# Patient Record
Sex: Female | Born: 1985 | Hispanic: Yes | Marital: Single | Smoking: Never smoker
Health system: Southern US, Community
[De-identification: ages and names within clinical notes are randomized; demographics above are authoritative.]

## PROBLEM LIST (undated history)

## (undated) ENCOUNTER — Inpatient Hospital Stay (HOSPITAL_COMMUNITY): Payer: Self-pay

## (undated) DIAGNOSIS — Z789 Other specified health status: Secondary | ICD-10-CM

## (undated) HISTORY — PX: NO PAST SURGERIES: SHX2092

---
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
## 2006-07-01 ENCOUNTER — Observation Stay (HOSPITAL_COMMUNITY): Admission: EM | Admit: 2006-07-01 | Discharge: 2006-07-04 | Payer: Self-pay | Admitting: Emergency Medicine

## 2012-04-24 ENCOUNTER — Emergency Department (HOSPITAL_COMMUNITY)
Admission: EM | Admit: 2012-04-24 | Discharge: 2012-04-24 | Disposition: A | Discharge status: Home / Self Care | Payer: Self-pay | Attending: Emergency Medicine | Admitting: Emergency Medicine

## 2012-04-24 ENCOUNTER — Encounter (HOSPITAL_COMMUNITY): Payer: Self-pay

## 2012-04-24 DIAGNOSIS — R300 Dysuria: Secondary | ICD-10-CM | POA: Insufficient documentation

## 2012-04-24 DIAGNOSIS — Z3202 Encounter for pregnancy test, result negative: Secondary | ICD-10-CM | POA: Insufficient documentation

## 2012-04-24 LAB — URINE MICROSCOPIC-ADD ON

## 2012-04-24 LAB — URINALYSIS, ROUTINE W REFLEX MICROSCOPIC
Bilirubin Urine: NEGATIVE
Glucose, UA: NEGATIVE mg/dL
Hgb urine dipstick: NEGATIVE
Ketones, ur: NEGATIVE mg/dL
Nitrite: NEGATIVE
Protein, ur: NEGATIVE mg/dL
Specific Gravity, Urine: 1.019 (ref 1.005–1.030)
Urobilinogen, UA: 0.2 mg/dL (ref 0.0–1.0)
pH: 6 (ref 5.0–8.0)

## 2012-04-24 LAB — POCT PREGNANCY, URINE: Preg Test, Ur: NEGATIVE

## 2012-04-24 MED ORDER — CEPHALEXIN 500 MG PO CAPS: 500.0000 mg | ORAL_CAPSULE | Freq: Four times a day (QID) | ORAL | Status: DC

## 2012-04-24 MED ORDER — OXYCODONE-ACETAMINOPHEN 5-325 MG PO TABS
1.0000 | ORAL_TABLET | Freq: Once | ORAL | Status: AC
  Administered 2012-04-24: 1 via ORAL
  Filled 2012-04-24: qty 1

## 2012-04-24 MED ORDER — IBUPROFEN 400 MG PO TABS
600.0000 mg | ORAL_TABLET | Freq: Once | ORAL | Status: AC
  Administered 2012-04-24: 600 mg via ORAL
  Filled 2012-04-24: qty 1

## 2012-04-24 NOTE — ED Notes (Signed)
Pt c/o pain with urination for past three days. No vaginal bleeding noted.

## 2012-04-25 LAB — URINE CULTURE: Colony Count: 45000

## 2012-04-27 NOTE — ED Provider Notes (Signed)
History    27 year old female with dysuria. Onset about 2 days ago. Complaining of painful urination which she describes as burning. No other complaints. No hematuria. No unusual vaginal bleeding or discharge. Abdominal or back pain. No fevers or chills. No nausea or vomiting. No other complaints. Intervention prior to arrival.  CSN: [id number]  Arrival date & time 04/24/12  1106   First MD Initiated Contact with Patient 04/24/12 1237      Chief Complaint  Patient presents with  . Dysuria    (Consider location/radiation/quality/duration/timing/severity/associated sxs/prior treatment) HPI  History reviewed. No pertinent past medical history.  History reviewed. No pertinent past surgical history.  No family history on file.  History  Substance Use Topics  . Smoking status: Never Smoker   . Smokeless tobacco: Not on file  . Alcohol Use: No    OB History   Grav Para Term Preterm Abortions TAB SAB Ect Mult Living                  Review of Systems  All systems reviewed and negative, other than as noted in HPI.   Allergies  Review of patient's allergies indicates no known allergies.  Home Medications   Current Outpatient Rx  Name  Route  Sig  Dispense  Refill  . cephALEXin (KEFLEX) 500 MG capsule   Oral   Take 1 capsule (500 mg total) by mouth 4 (four) times daily.   20 capsule   0     BP 94/55  Pulse 74  Temp(Src) 97.7 F (36.5 C) (Oral)  Resp 18  SpO2 99%  Physical Exam  Nursing note and vitals reviewed. Constitutional: She appears well-developed and well-nourished. No distress.  HENT:  Head: Normocephalic and atraumatic.  Eyes: Conjunctivae are normal. Right eye exhibits no discharge. Left eye exhibits no discharge.  Neck: Neck supple.  Cardiovascular: Normal rate, regular rhythm and normal heart sounds.  Exam reveals no gallop and no friction rub.   No murmur heard. Pulmonary/Chest: Effort normal and breath sounds normal. No respiratory distress.   Abdominal: Soft. She exhibits no distension. There is no tenderness.  Genitourinary:  No costovertebral angle tenderness  Musculoskeletal: She exhibits no edema and no tenderness.  Neurological: She is alert.  Skin: Skin is warm and dry.  Psychiatric: She has a normal mood and affect. Her behavior is normal. Thought content normal.    ED Course  Procedures (including critical care time)  Labs Reviewed  URINALYSIS, ROUTINE W REFLEX MICROSCOPIC - Abnormal; Notable for the following:    Leukocytes, UA SMALL (*)    All other components within normal limits  URINE MICROSCOPIC-ADD ON - Abnormal; Notable for the following:    Bacteria, UA FEW (*)    All other components within normal limits  URINE CULTURE  POCT PREGNANCY, URINE   No results found.   1. Dysuria       MDM  27 year old female with dysuria. Benign abdominal examination. Afebrile and well appearing. Urinalysis is not particularly impressive, but given patient's symptoms will treat. Emergent return precautions were discussed        Raeford Razor, MD 04/27/12 0006

## 2013-04-27 ENCOUNTER — Ambulatory Visit: Payer: Self-pay | Admitting: Family Medicine

## 2013-04-27 VITALS — BP 102/62 | HR 62 | Temp 98.1°F | Resp 18 | Ht 61.75 in | Wt 167.0 lb

## 2013-04-27 DIAGNOSIS — N898 Other specified noninflammatory disorders of vagina: Secondary | ICD-10-CM

## 2013-04-27 DIAGNOSIS — R350 Frequency of micturition: Secondary | ICD-10-CM

## 2013-04-27 DIAGNOSIS — M549 Dorsalgia, unspecified: Secondary | ICD-10-CM

## 2013-04-27 LAB — POCT UA - MICROSCOPIC ONLY
CASTS, UR, LPF, POC: NEGATIVE
CRYSTALS, UR, HPF, POC: NEGATIVE
MUCUS UA: NEGATIVE
RBC, URINE, MICROSCOPIC: NEGATIVE
WBC, Ur, HPF, POC: NEGATIVE
Yeast, UA: NEGATIVE

## 2013-04-27 LAB — POCT CBC
GRANULOCYTE PERCENT: 57 % (ref 37–80)
HCT, POC: 43.1 % (ref 37.7–47.9)
Hemoglobin: 13.5 g/dL (ref 12.2–16.2)
Lymph, poc: 2 (ref 0.6–3.4)
MCH: 30.3 pg (ref 27–31.2)
MCHC: 31.3 g/dL — AB (ref 31.8–35.4)
MCV: 96.8 fL (ref 80–97)
MID (CBC): 0.3 (ref 0–0.9)
MPV: 9.2 fL (ref 0–99.8)
PLATELET COUNT, POC: 217 10*3/uL (ref 142–424)
POC GRANULOCYTE: 3.1 (ref 2–6.9)
POC LYMPH PERCENT: 36.7 %L (ref 10–50)
POC MID %: 6.3 % (ref 0–12)
RBC: 4.45 M/uL (ref 4.04–5.48)
RDW, POC: 13.7 %
WBC: 5.5 10*3/uL (ref 4.6–10.2)

## 2013-04-27 LAB — POCT URINALYSIS DIPSTICK
Bilirubin, UA: NEGATIVE
Glucose, UA: NEGATIVE
KETONES UA: NEGATIVE
Leukocytes, UA: NEGATIVE
Nitrite, UA: NEGATIVE
PH UA: 7
PROTEIN UA: NEGATIVE
RBC UA: NEGATIVE
SPEC GRAV UA: 1.01
Urobilinogen, UA: 0.2

## 2013-04-27 LAB — POCT WET PREP WITH KOH
KOH Prep POC: NEGATIVE
Trichomonas, UA: NEGATIVE
Yeast Wet Prep HPF POC: NEGATIVE

## 2013-04-27 LAB — POCT URINE PREGNANCY: Preg Test, Ur: NEGATIVE

## 2013-04-27 MED ORDER — CEPHALEXIN 500 MG PO CAPS: 500.0000 mg | ORAL_CAPSULE | Freq: Four times a day (QID) | ORAL | Status: DC

## 2013-04-27 NOTE — Progress Notes (Addendum)
[facility] [address], [place] [id number] [phone]  Date:  04/27/2013   Name:  Erin Carney   DOB:  May 20, 1985   MRN:  [id number]  PCP:  Pcp Not In System    Chief Complaint: Nocturia and Flank Pain   History of Present Illness:  Erin Carney is a 28 y.o. very pleasant female patient who presents with the following:  Here today with illness. New pt to our clinic.   She has a urinary complaint.  She notes that she passes just a little urine at a time, and her back "burns".  She has had this problem for 2 months. Frequency seems to be worse at night.  For the last week she feels like she will need to go but nothing will be there.  Also admits she has noted vaginal discharge for about 2 weeks.    No fever, no nausea or vomiting.   No blood in her urine.   She is generally healthy.   Her LMP was 04/07/12.   She was seen in the ED about one year ago with similar sx and was tx for a UTI- her urine culture at that time grew mixed bacteria.  She states this is the same thing.    [place].  Speaks no English  On exam noted a mild tenderness in her RLQ- she states this has been present for 10 years since she delivered a baby  There are no active problems to display for this patient.   No past medical history on file.  No past surgical history on file.  History  Substance Use Topics  . Smoking status: Never Smoker   . Smokeless tobacco: Not on file  . Alcohol Use: No    No family history on file.  No Known Allergies  Medication list has been reviewed and updated.  No current outpatient prescriptions on file prior to visit.   No current facility-administered medications on file prior to visit.    Review of Systems:  As per HPI- otherwise negative.   Physical Examination: Filed Vitals:   04/27/13 1237  BP: 102/62  Pulse: 62  Temp: 98.1 F (36.7 C)  Resp: 18   Filed Vitals:   04/27/13 1237  Height: 5'  1.75" (1.568 m)  Weight: 167 lb (75.751 kg)   Body mass index is 30.81 kg/(m^2). Ideal Body Weight: Weight in (lb) to have BMI = 25: 135.3  GEN: WDWN, NAD, Non-toxic, A & O x 3, overweight, looks well HEENT: Atraumatic, Normocephalic. Neck supple. No masses, No LAD. Ears and Nose: No external deformity. CV: RRR, No M/G/R. No JVD. No thrill. No extra heart sounds. PULM: CTA B, no wheezes, crackles, rhonchi. No retractions. No resp. distress. No accessory muscle use. ABD: S, NT, ND, +BS. No rebound. No HSM.  No CVA tenderness.  Minimal RLQ tenderness  EXTR: No c/c/e NEURO Normal gait.  PSYCH: Normally interactive. Conversant. Not depressed or anxious appearing.  Calm demeanor.  GU: normal exam, no discharge, lesions or CMT.  No adnexal masses or tenderness.    Results for orders placed in visit on 04/27/13  POCT UA - MICROSCOPIC ONLY      Result Value Ref Range   WBC, Ur, HPF, POC neg     RBC, urine, microscopic neg     Bacteria, U Microscopic 1+     Mucus, UA neg     Epithelial cells, urine  per micros 1-5     Crystals, Ur, HPF, POC neg     Casts, Ur, LPF, POC neg     Yeast, UA neg    POCT URINALYSIS DIPSTICK      Result Value Ref Range   Color, UA yellow     Clarity, UA clear     Glucose, UA neg     Bilirubin, UA neg     Ketones, UA neg     Spec Grav, UA 1.010     Blood, UA neg     pH, UA 7.0     Protein, UA neg     Urobilinogen, UA 0.2     Nitrite, UA neg     Leukocytes, UA Negative    POCT CBC      Result Value Ref Range   WBC 5.5  4.6 - 10.2 K/uL   Lymph, poc 2.0  0.6 - 3.4   POC LYMPH PERCENT 36.7  10 - 50 %L   MID (cbc) 0.3  0 - 0.9   POC MID % 6.3  0 - 12 %M   POC Granulocyte 3.1  2 - 6.9   Granulocyte percent 57.0  37 - 80 %G   RBC 4.45  4.04 - 5.48 M/uL   Hemoglobin 13.5  12.2 - 16.2 g/dL   HCT, POC 16.1  09.6 - 47.9 %   MCV 96.8  80 - 97 fL   MCH, POC 30.3  27 - 31.2 pg   MCHC 31.3 (*) 31.8 - 35.4 g/dL   RDW, POC 04.5     Platelet Count, POC 217  142  - 424 K/uL   MPV 9.2  0 - 99.8 fL  POCT WET PREP WITH KOH      Result Value Ref Range   Trichomonas, UA Negative     Clue Cells Wet Prep HPF POC 0-1     Epithelial Wet Prep HPF POC 2-5     Yeast Wet Prep HPF POC Neg     Bacteria Wet Prep HPF POC 3+     RBC Wet Prep HPF POC 0-2     WBC Wet Prep HPF POC 0-7     KOH Prep POC Negative    POCT URINE PREGNANCY      Result Value Ref Range   Preg Test, Ur Negative     Assessment and Plan: Urinary frequency - Plan: POCT UA - Microscopic Only, POCT urinalysis dipstick, POCT CBC, GC/Chlamydia Probe Amp, Urine culture, cephALEXin (KEFLEX) 500 MG capsule  Back pain - Plan: POCT urine pregnancy  Vaginal discharge - Plan: POCT Wet Prep with KOH  Possible UTI but her urine is not of much concern.  Will treat with keflex while we await her urine culture and genprobe results.   She does endorse mild RLQ tenderness- however her CBC is normal and she states this has been stable for 10 years.   Will follow-up with labs, asked her to please let me know if any problems in the meantime or if she is getting worse  Signed Abbe Amsterdam, MD  2/21: have tried to reach her by phone for the last few days, no answer and VM not set up.   Will send her a letter

## 2013-04-28 LAB — URINE CULTURE
COLONY COUNT: NO GROWTH
Organism ID, Bacteria: NO GROWTH

## 2013-04-28 LAB — GC/CHLAMYDIA PROBE AMP
CT PROBE, AMP APTIMA: NEGATIVE
GC Probe RNA: NEGATIVE

## 2013-05-01 ENCOUNTER — Encounter: Payer: Self-pay | Admitting: Family Medicine

## 2014-07-25 ENCOUNTER — Inpatient Hospital Stay (HOSPITAL_COMMUNITY): Payer: Self-pay

## 2014-07-25 ENCOUNTER — Encounter (HOSPITAL_COMMUNITY): Payer: Self-pay | Admitting: *Deleted

## 2014-07-25 ENCOUNTER — Inpatient Hospital Stay (HOSPITAL_COMMUNITY)
Admission: AD | Admit: 2014-07-25 | Discharge: 2014-07-25 | Disposition: A | Discharge status: Home / Self Care | Payer: Self-pay | Source: Ambulatory Visit | Attending: Family Medicine | Admitting: Family Medicine

## 2014-07-25 DIAGNOSIS — O208 Other hemorrhage in early pregnancy: Secondary | ICD-10-CM | POA: Insufficient documentation

## 2014-07-25 DIAGNOSIS — O209 Hemorrhage in early pregnancy, unspecified: Secondary | ICD-10-CM

## 2014-07-25 DIAGNOSIS — Z3A01 Less than 8 weeks gestation of pregnancy: Secondary | ICD-10-CM | POA: Insufficient documentation

## 2014-07-25 LAB — POCT PREGNANCY, URINE: Preg Test, Ur: POSITIVE — AB

## 2014-07-25 LAB — CBC WITH DIFFERENTIAL/PLATELET
BASOS ABS: 0 10*3/uL (ref 0.0–0.1)
Basophils Relative: 0 % (ref 0–1)
EOS PCT: 1 % (ref 0–5)
Eosinophils Absolute: 0 10*3/uL (ref 0.0–0.7)
HEMATOCRIT: 37.9 % (ref 36.0–46.0)
HEMOGLOBIN: 13.3 g/dL (ref 12.0–15.0)
LYMPHS PCT: 29 % (ref 12–46)
Lymphs Abs: 1.9 10*3/uL (ref 0.7–4.0)
MCH: 31.1 pg (ref 26.0–34.0)
MCHC: 35.1 g/dL (ref 30.0–36.0)
MCV: 88.8 fL (ref 78.0–100.0)
MONO ABS: 0.4 10*3/uL (ref 0.1–1.0)
MONOS PCT: 7 % (ref 3–12)
NEUTROS ABS: 4.2 10*3/uL (ref 1.7–7.7)
Neutrophils Relative %: 63 % (ref 43–77)
Platelets: 226 10*3/uL (ref 150–400)
RBC: 4.27 MIL/uL (ref 3.87–5.11)
RDW: 12.8 % (ref 11.5–15.5)
WBC: 6.7 10*3/uL (ref 4.0–10.5)

## 2014-07-25 LAB — WET PREP, GENITAL
Clue Cells Wet Prep HPF POC: NONE SEEN
TRICH WET PREP: NONE SEEN
YEAST WET PREP: NONE SEEN

## 2014-07-25 LAB — URINALYSIS, ROUTINE W REFLEX MICROSCOPIC
Bilirubin Urine: NEGATIVE
GLUCOSE, UA: NEGATIVE mg/dL
KETONES UR: NEGATIVE mg/dL
Nitrite: NEGATIVE
PROTEIN: NEGATIVE mg/dL
Specific Gravity, Urine: 1.015 (ref 1.005–1.030)
UROBILINOGEN UA: 0.2 mg/dL (ref 0.0–1.0)
pH: 6 (ref 5.0–8.0)

## 2014-07-25 LAB — URINE MICROSCOPIC-ADD ON

## 2014-07-25 LAB — HCG, QUANTITATIVE, PREGNANCY: HCG, BETA CHAIN, QUANT, S: 85060 m[IU]/mL — AB (ref <5)

## 2014-07-25 LAB — ABO/RH: ABO/RH(D): O POS

## 2014-07-25 LAB — OB RESULTS CONSOLE GC/CHLAMYDIA: Gonorrhea: NEGATIVE

## 2014-07-25 IMAGING — US US OB TRANSVAGINAL
1 series · 14 of 28 positions shown · non-contrast
Comparison: None.

CLINICAL DATA: Vaginal bleeding

EXAM:
OBSTETRIC <14 WK US AND TRANSVAGINAL OB US
TECHNIQUE: Both transabdominal and transvaginal ultrasound examinations were
performed for complete evaluation of the gestation as well as the
maternal uterus, adnexal regions, and pelvic cul-de-sac.
Transvaginal technique was performed to assess early pregnancy.

[Series 1: us ob comp less 14 wk · 55 acquisitions, 14 frames shown]
[im 3/55]
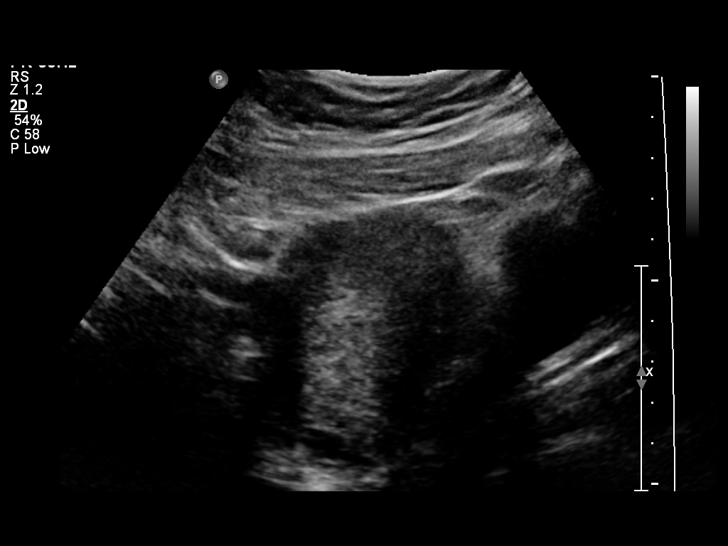
[im 7/55]
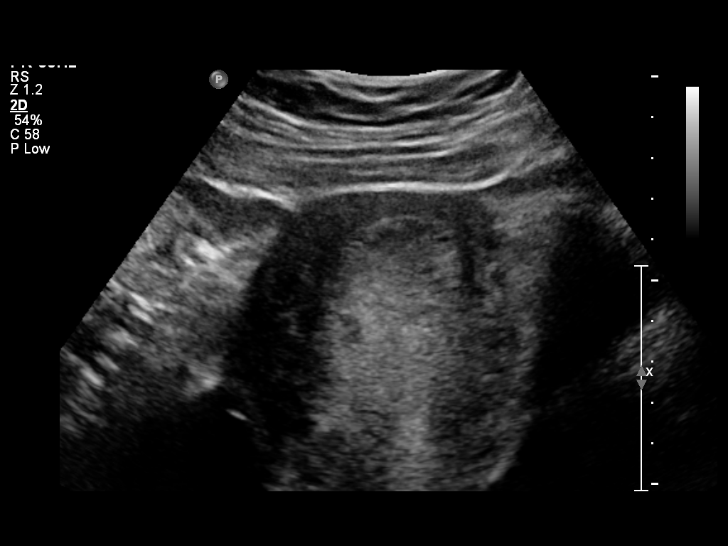
[im 11/55]
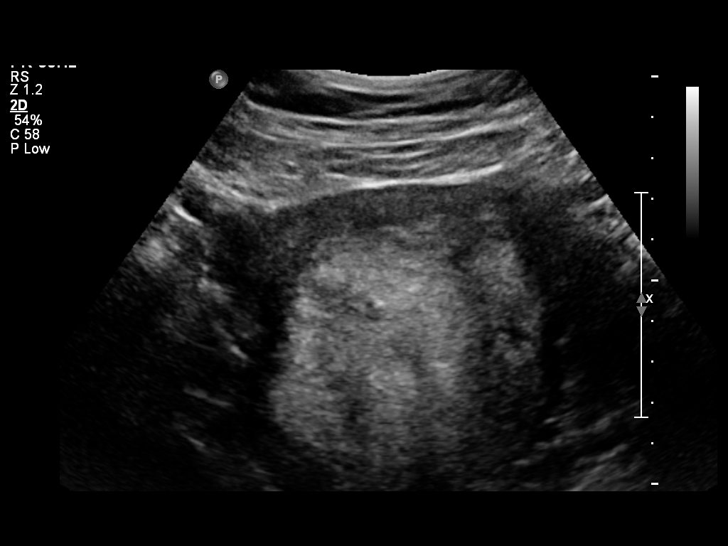
[im 15/55]
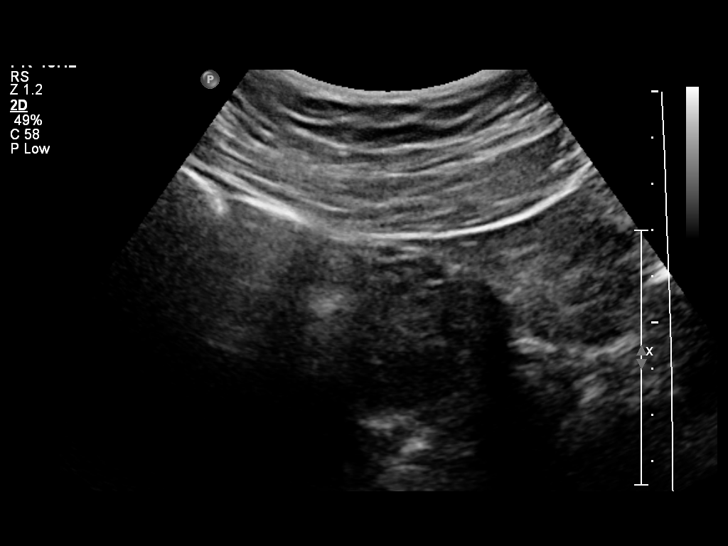
[im 19/55]
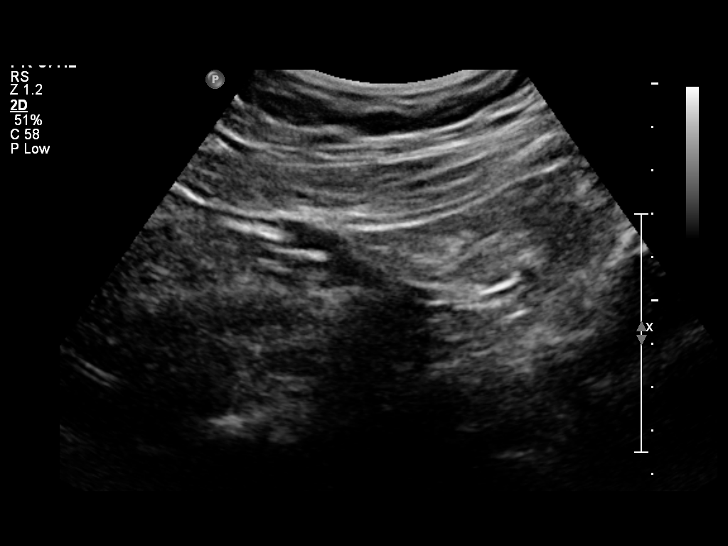
[im 23/55]
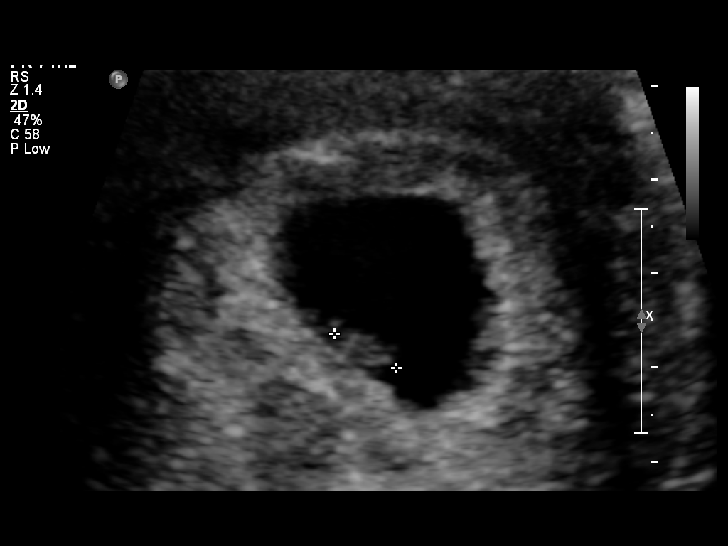
[im 27/55]
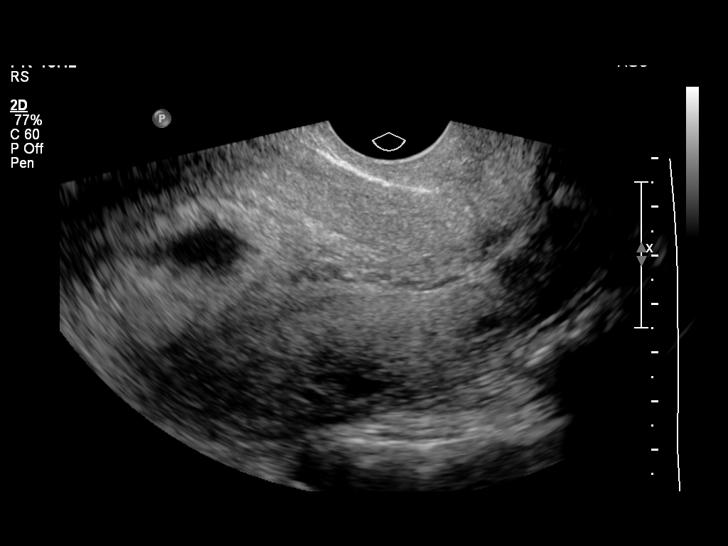
[im 31/55]
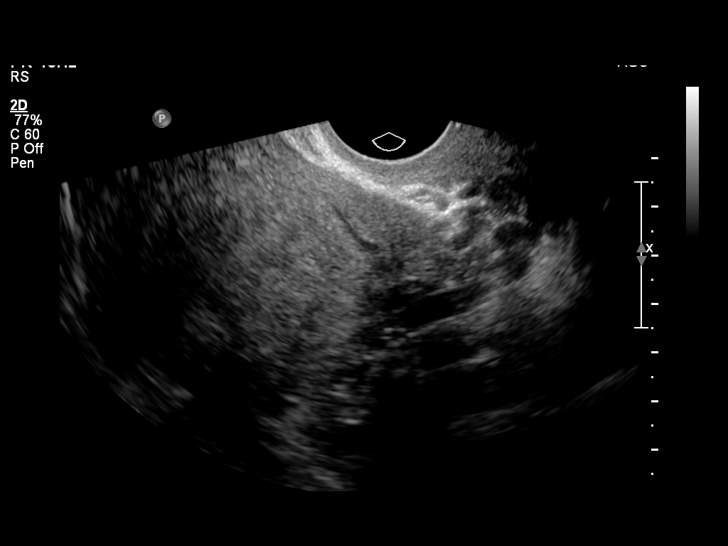
[im 35/55]
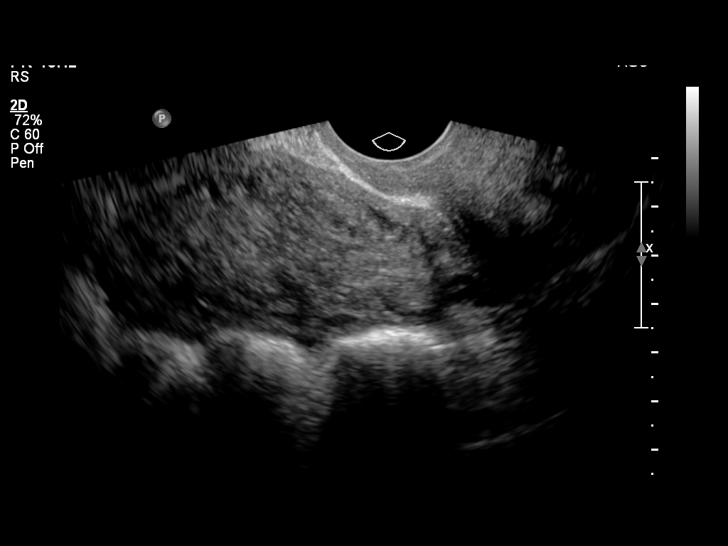
[im 39/55]
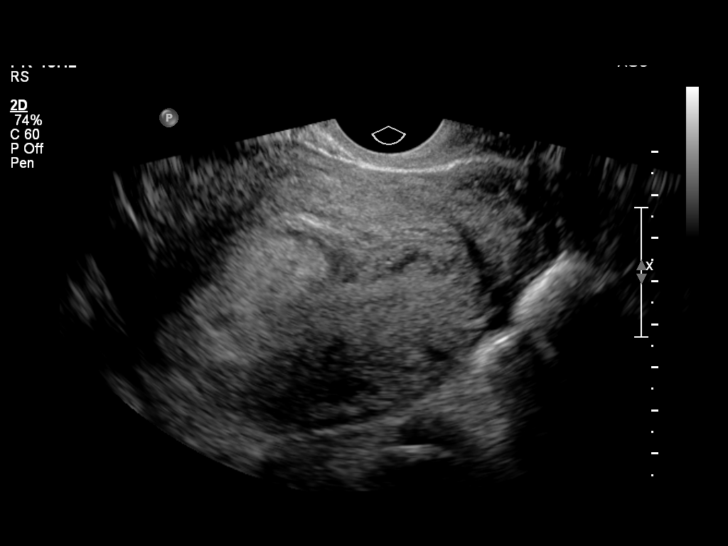
[im 43/55]
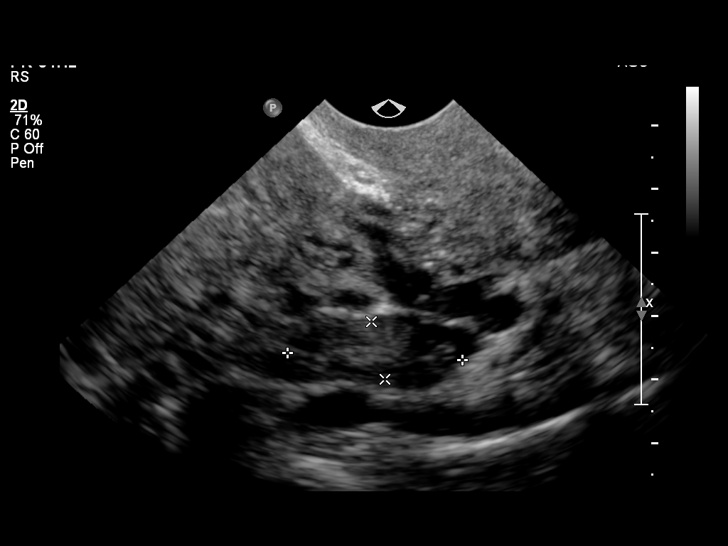
[im 47/55]
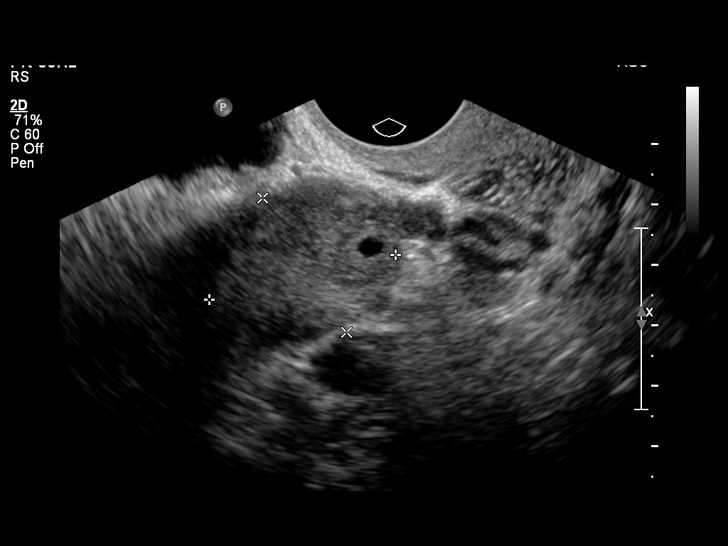
[im 51/55]
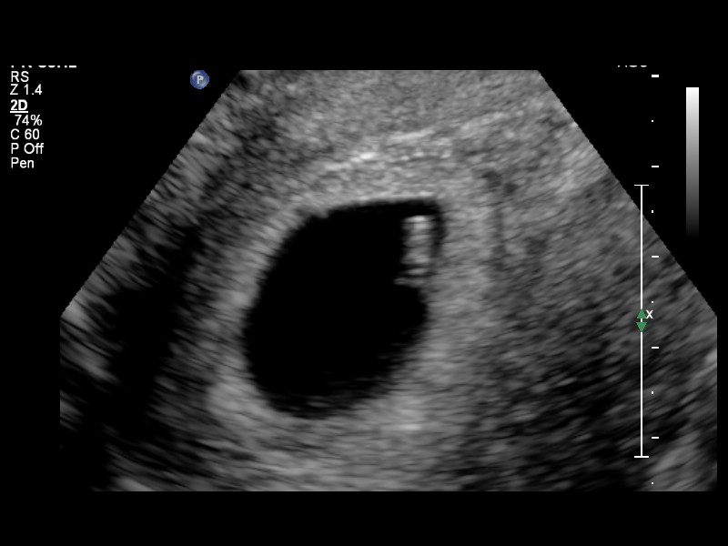
[im 55/55]
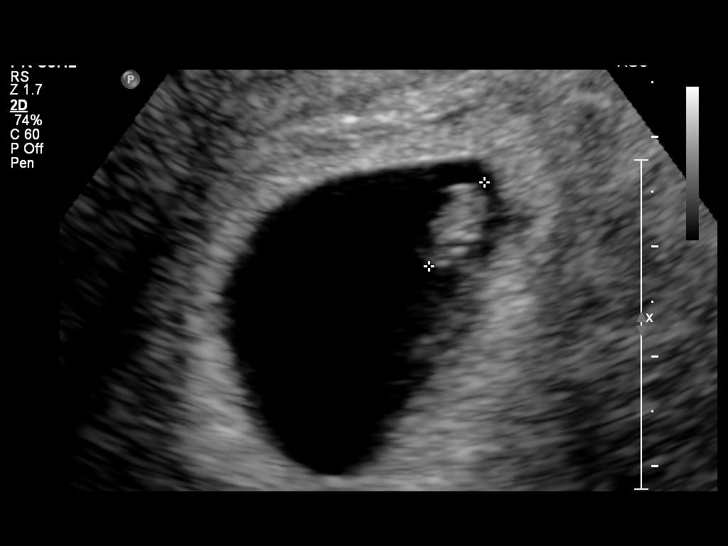

[14 of 28 positions shown; findings below may reference images not displayed]

FINDINGS: Intrauterine gestational sac: Visualized/normal in shape.

Yolk sac:  Present

Embryo:  Present

Cardiac Activity: Present

Heart Rate: 128  bpm

CRL:  8.8  mm   7 w   0 d                  US EDC: [DATE]

Maternal uterus/adnexae: No adnexal mass. Normal bilateral ovaries.
The right ovary measures 2.8 x 0.9 x 3.1 cm. Left ovary measures
x 2.6 x 2.8 cm. There is a small subcentimeter subchorionic
hemorrhage. There is no pelvic free fluid.
IMPRESSION: 1. Single live intrauterine pregnancy.
2. Small subchorionic hemorrhage. Attention on follow-up examination
is recommended.

## 2014-07-25 NOTE — MAU Note (Signed)
Pt reports she started having vag bleeding and some cramping that started today

## 2014-07-25 NOTE — MAU Provider Note (Signed)
History     CSN: [id number]  Arrival date and time: 07/25/14 1309   First Provider Initiated Contact with Patient 07/25/14 1333      Chief Complaint  Patient presents with  . Vaginal Bleeding  . Abdominal Pain   HPI [name] is a 29 y.o. G1P0 at Unknown who presents to MAU today with complaint of vaginal bleeding. The patient states light bleeding started today. She states mild cramping of the lower abdomen associated with the bleeding. The denies N/V/D or fever. LMP 05/28/14. Patient states +UPT at [facility] recently.   OB History    Gravida Para Term Preterm AB TAB SAB Ectopic Multiple Living   0      3      History reviewed. No pertinent past medical history.  History reviewed. No pertinent past surgical history.  No family history on file.  History  Substance Use Topics  . Smoking status: Never Smoker   . Smokeless tobacco: Never Used  . Alcohol Use: No    Allergies: No Known Allergies  Prescriptions prior to admission  Medication Sig Dispense Refill Last Dose  . Prenatal Vit-Fe Fumarate-FA (PRENATAL MULTIVITAMIN) TABS tablet Take 1 tablet by mouth daily at 12 noon.   07/24/2014 at Unknown time  . cephALEXin (KEFLEX) 500 MG capsule Take 1 capsule (500 mg total) by mouth 4 (four) times daily. (Patient not taking: Reported on 07/25/2014) 14 capsule 7     Review of Systems  Constitutional: Negative for fever and malaise/fatigue.  Gastrointestinal: Positive for abdominal pain. Negative for nausea, vomiting, diarrhea and constipation.  Genitourinary:       + vaginal bleeding   Physical Exam   Blood pressure 96/58, pulse 65, temperature 97.9 F (36.6 C), temperature source Oral, resp. rate 18, weight 175 lb (79.379 kg), last menstrual period 05/28/2014.  Physical Exam  Nursing note and vitals reviewed. Constitutional: She is oriented to person, place, and time. She appears well-developed and well-nourished. No distress.  HENT:  Head: Normocephalic and  atraumatic.  Cardiovascular: Normal rate.   Respiratory: Effort normal.  GI: Soft. She exhibits no distension and no mass. There is no tenderness. There is no rebound and no guarding.  Genitourinary: Uterus is tender (mild). Uterus is not enlarged (exam limited by maternal body habitus). Cervix exhibits no motion tenderness, no discharge and no friability. Right adnexum displays tenderness (mild). Right adnexum displays no mass. Left adnexum displays tenderness (mild). Left adnexum displays no mass. There is bleeding (scant brown blood in the vagina) in the vagina. No vaginal discharge found.  Neurological: She is alert and oriented to person, place, and time.  Skin: Skin is warm and dry. No erythema.  Psychiatric: She has a normal mood and affect.   Results for orders placed or performed during the hospital encounter of 07/25/14 (from the past 24 hour(s))  Urinalysis, Routine w reflex microscopic     Status: Abnormal   Collection Time: 07/25/14  1:14 PM  Result Value Ref Range   Color, Urine YELLOW YELLOW   APPearance CLEAR CLEAR   Specific Gravity, Urine 1.015 1.005 - 1.030   pH 6.0 5.0 - 8.0   Glucose, UA NEGATIVE NEGATIVE mg/dL   Hgb urine dipstick MODERATE (A) NEGATIVE   Bilirubin Urine NEGATIVE NEGATIVE   Ketones, ur NEGATIVE NEGATIVE mg/dL   Protein, ur NEGATIVE NEGATIVE mg/dL   Urobilinogen, UA 0.2 0.0 - 1.0 mg/dL   Nitrite NEGATIVE NEGATIVE   Leukocytes, UA TRACE (A) NEGATIVE  Urine microscopic-add on     Status: Abnormal   Collection Time: 07/25/14  1:14 PM  Result Value Ref Range   WBC, UA 0-2 <3 WBC/hpf   RBC / HPF 3-6 <3 RBC/hpf   Bacteria, UA FEW (A) RARE  Pregnancy, urine POC     Status: Abnormal   Collection Time: 07/25/14  1:42 PM  Result Value Ref Range   Preg Test, Ur POSITIVE (A) NEGATIVE  CBC with Differential/Platelet     Status: None   Collection Time: 07/25/14  2:20 PM  Result Value Ref Range   WBC 6.7 4.0 - 10.5 K/uL   RBC 4.27 3.87 - 5.11 MIL/uL    Hemoglobin 13.3 12.0 - 15.0 g/dL   HCT 78.237.9 95.636.0 - 21.346.0 %   MCV 88.8 78.0 - 100.0 fL   MCH 31.1 26.0 - 34.0 pg   MCHC 35.1 30.0 - 36.0 g/dL   RDW 08.612.8 57.811.5 - 46.915.5 %   Platelets 226 150 - 400 K/uL   Neutrophils Relative % 63 43 - 77 %   Neutro Abs 4.2 1.7 - 7.7 K/uL   Lymphocytes Relative 29 12 - 46 %   Lymphs Abs 1.9 0.7 - 4.0 K/uL   Monocytes Relative 7 3 - 12 %   Monocytes Absolute 0.4 0.1 - 1.0 K/uL   Eosinophils Relative 1 0 - 5 %   Eosinophils Absolute 0.0 0.0 - 0.7 K/uL   Basophils Relative 0 0 - 1 %   Basophils Absolute 0.0 0.0 - 0.1 K/uL  ABO/Rh     Status: None (Preliminary result)   Collection Time: 07/25/14  2:20 PM  Result Value Ref Range   ABO/RH(D) O POS   hCG, quantitative, pregnancy     Status: Abnormal   Collection Time: 07/25/14  2:20 PM  Result Value Ref Range   hCG, Beta Chain, Quant, S 85060 (H) <5 mIU/mL  Wet prep, genital     Status: Abnormal   Collection Time: 07/25/14  2:50 PM  Result Value Ref Range   Yeast Wet Prep HPF POC NONE SEEN NONE SEEN   Trich, Wet Prep NONE SEEN NONE SEEN   Clue Cells Wet Prep HPF POC NONE SEEN NONE SEEN   WBC, Wet Prep HPF POC FEW (A) NONE SEEN   [place] Ob Comp Less 14 Wks  07/25/2014   CLINICAL DATA:  Vaginal bleeding  EXAM: OBSTETRIC <14 WK US AND TRANSVAGINAL OB US  TECHNIQUE: Both transabdominal and transvaginal ultrasound examinations were performed for complete evaluation of the gestation as well as the maternal uterus, adnexal regions, and pelvic cul-de-sac. Transvaginal technique was performed to assess early pregnancy.  COMPARISON:  None.  FINDINGS: Intrauterine gestational sac: Visualized/normal in shape.  Yolk sac:  Present  Embryo:  Present  Cardiac Activity: Present  Heart Rate: 128  bpm  CRL:  8.8  mm   7 w   0 d                  US EDC: 03/13/2015  Maternal uterus/adnexae: No adnexal mass. Normal bilateral ovaries. The right ovary measures 2.8 x 0.9 x 3.1 cm. Left ovary measures 3.2 x 2.6 x 2.8 cm. There is a small  subcentimeter subchorionic hemorrhage. There is no pelvic free fluid.  IMPRESSION: 1. Single live intrauterine pregnancy. 2. Small subchorionic hemorrhage. Attention on follow-up examination is recommended.   Electronically Signed   By: Elige KoHetal  Patel   On: 07/25/2014 15:32   [place] Ob Transvaginal  07/25/2014  CLINICAL DATA:  Vaginal bleeding  EXAM: OBSTETRIC <14 WK US AND TRANSVAGINAL OB US  TECHNIQUE: Both transabdominal and transvaginal ultrasound examinations were performed for complete evaluation of the gestation as well as the maternal uterus, adnexal regions, and pelvic cul-de-sac. Transvaginal technique was performed to assess early pregnancy.  COMPARISON:  None.  FINDINGS: Intrauterine gestational sac: Visualized/normal in shape.  Yolk sac:  Present  Embryo:  Present  Cardiac Activity: Present  Heart Rate: 128  bpm  CRL:  8.8  mm   7 w   0 d                  US EDC: 03/13/2015  Maternal uterus/adnexae: No adnexal mass. Normal bilateral ovaries. The right ovary measures 2.8 x 0.9 x 3.1 cm. Left ovary measures 3.2 x 2.6 x 2.8 cm. There is a small subcentimeter subchorionic hemorrhage. There is no pelvic free fluid.  IMPRESSION: 1. Single live intrauterine pregnancy. 2. Small subchorionic hemorrhage. Attention on follow-up examination is recommended.   Electronically Signed   By: Elige KoHetal  Patel   On: 07/25/2014 15:32     MAU Course  Procedures None  MDM +UPT UA, wet prep, GC/chlamydia, CBC, ABO/Rh, quant hCG, HIV, RPR and US today to rule out ectopic pregnancy  Assessment and Plan  A: SIUP at 2143w0d Small subchorionic hemorrhage  P: Discharge home Bleeding precautions discussed Pelvic rest advised Patient [facility] as scheduled Patient may return to MAU as needed or if her condition were to change or worsen   Marny LowensteinJulie N Wenzel, PA-C  07/25/2014, 3:51 PM

## 2014-07-25 NOTE — MAU Note (Signed)
Went to lobby to call pt back and lab called patient at same time. Pt to lab for blood draw.

## 2014-07-25 NOTE — Discharge Instructions (Signed)
Reposo plvico  (Pelvic Rest) El reposo plvico se recomienda a las mujeres cuando:   La placenta cubre parcial o completamente la abertura del cuello del tero (placenta previa).  Hay sangrado entre la pared del tero y el saco amnitico en el primer trimestre (hemorragia subcorinica).  El cuello uterino comienza a abrirse sin iniciarse el trabajo de parto (cuello uterino incompetente, insuficiencia cervical).  El Tylertrabajo de parto se inicia muy pronto (parto prematuro). INSTRUCCIONES PARA EL CUIDADO EN EL HOGAR   No tenga relaciones sexuales, estimulacin, ni orgasmos.  No use tampones, no se haga duchas vaginales ni coloque ningn objeto en la vagina.  No levante objetos que pesen ms de 10 libras (4,5 kg).  Evite las actividades extenuantes o tensionar los msculos de la pelvis. SOLICITE ATENCIN MDICA SI:   Tiene un sangrado vaginal durante el embarazo. Considrelo como una posible emergencia.  Siente clicos en la zona baja del estmago (ms fuertes que los clicos menstruales).  Nota flujo vaginal (acuoso, con moco o [place] Sandwichsangre).  Siente un dolor en la espalda leve y sordo.  Tiene contracciones regulares o endurecimiento del tero. SOLICITE ATENCIN MDICA DE INMEDIATO SI:  Observa sangrado vaginal y tiene placenta previa.  Document Released: 11/20/2011 [facility] Patient Information 2015 [place]ExitCare, [place]. This information is not intended to replace advice given to you by your health care provider. Make sure you discuss any questions you have with your health care provider. Hematoma subcorinico (Subchorionic Hematoma) Un hematoma subcorinico es una acumulacin de sangre entre la pared externa de la placenta y la pared interna del la matriz (tero). La placenta es el rgano que conecta el feto a la pared del tero. La placenta realiza la funcin de alimentacin, respiracin (oxgeno al feto) y el trabajo de eliminacin de desechos (excrecin) del feto.  Un hematoma  subcorinico es la anormalidad ms frecuente encontrada en una ecografa durante el primer trimestre o principios del segundo trimestre del embarazo. Si ha habido poca o ninguna hemorragia vaginal, generalmente los pequeos hematomas se reducen por su propia cuenta y no afectan al beb ni al Vanetta Muldersembarazo. La sangre es absorbida gradualmente durante una o South Renovodos semanas. Cuando la hemorragia comienza ms tarde en el embarazo o el hematoma es ms grande o se produce en una paciente de edad avanzada, el resultado puede no ser tan bueno. Los grandes hematomas pueden agrandarse an ms y Lesothoaumenta las posibilidades de aborto espontneo. El hematoma subcorinico tambin aumenta el riesgo de desprendimiento precoz de la placenta del tero, muerte fetal y Sport and exercise psychologistparto prematuro. INSTRUCCIONES PARA EL CUIDADO EN EL HOGAR   Repose en cama si el mdico se lo recomienda. Aunque el reposo en cama no evitar la hemorragia o un aborto espontneo, su mdico puede recomendarlo.  Evite levantar objetos pesados (ms de 10 libras [4,5 kg]), hacer ejercicio, tener relaciones sexuales o realizar duchas vaginales segn se lo indique el profesional.  Lleve un registro de la cantidad y Hydrographic surveyorgrado de remojo (saturacin) de las toallas higinicas que Landscape architectutiliza cada da. Anote esta informacin.  No use tampones.  Cumpla con todas las visitas de control, segn le indique su mdico. El profesional podr pedirle que se realice anlisis de seguimiento, pruebas de Madisonvilleultrasonido o Red Banksambas. SOLICITE ATENCIN MDICA DE INMEDIATO SI:   Siente calambres intensos en el estmago, en la espalda, en el abdomen o en la pelvis.  Tiene fiebre.  Elimina cogulos o tejidos grandes. Guarde los tejidos para que su mdico los vea.  Si la hemorragia aumenta o siente mareos,  debilidad o tiene episodios de [address]os. Document Released: 06/13/2008 Document Revised: 12/16/2012 [facility] Patient Information 2015 [place]ExitCare, [place]LLC. This information is not intended to replace  advice given to you by your health care provider. Make sure you discuss any questions you have with your health care provider.

## 2014-07-26 LAB — GC/CHLAMYDIA PROBE AMP (~~LOC~~) NOT AT ARMC
Chlamydia: NEGATIVE
NEISSERIA GONORRHEA: NEGATIVE

## 2014-07-26 LAB — RPR: RPR: NONREACTIVE

## 2014-07-26 LAB — HIV ANTIBODY (ROUTINE TESTING W REFLEX): HIV Screen 4th Generation wRfx: NONREACTIVE

## 2014-08-04 ENCOUNTER — Other Ambulatory Visit (HOSPITAL_COMMUNITY): Payer: Self-pay | Admitting: Nurse Practitioner

## 2014-08-04 DIAGNOSIS — Z3682 Encounter for antenatal screening for nuchal translucency: Secondary | ICD-10-CM

## 2014-08-04 LAB — OB RESULTS CONSOLE ANTIBODY SCREEN: Antibody Screen: NEGATIVE

## 2014-08-04 LAB — OB RESULTS CONSOLE HEPATITIS B SURFACE ANTIGEN: Hepatitis B Surface Ag: NEGATIVE

## 2014-08-04 LAB — OB RESULTS CONSOLE ABO/RH: RH Type: POSITIVE

## 2014-08-04 LAB — OB RESULTS CONSOLE RUBELLA ANTIBODY, IGM: RUBELLA: NON-IMMUNE/NOT IMMUNE

## 2014-08-11 ENCOUNTER — Encounter (HOSPITAL_COMMUNITY): Payer: Self-pay | Admitting: *Deleted

## 2014-08-11 ENCOUNTER — Inpatient Hospital Stay (HOSPITAL_COMMUNITY): Payer: Self-pay

## 2014-08-11 ENCOUNTER — Inpatient Hospital Stay (HOSPITAL_COMMUNITY)
Admission: AD | Admit: 2014-08-11 | Discharge: 2014-08-11 | Disposition: A | Discharge status: Home / Self Care | Payer: Self-pay | Source: Ambulatory Visit | Attending: Obstetrics & Gynecology | Admitting: Obstetrics & Gynecology

## 2014-08-11 DIAGNOSIS — O9989 Other specified diseases and conditions complicating pregnancy, childbirth and the puerperium: Secondary | ICD-10-CM

## 2014-08-11 DIAGNOSIS — E860 Dehydration: Secondary | ICD-10-CM | POA: Insufficient documentation

## 2014-08-11 DIAGNOSIS — Z3A09 9 weeks gestation of pregnancy: Secondary | ICD-10-CM | POA: Insufficient documentation

## 2014-08-11 DIAGNOSIS — R1032 Left lower quadrant pain: Secondary | ICD-10-CM

## 2014-08-11 DIAGNOSIS — O209 Hemorrhage in early pregnancy, unspecified: Secondary | ICD-10-CM | POA: Insufficient documentation

## 2014-08-11 LAB — URINALYSIS, ROUTINE W REFLEX MICROSCOPIC
Bilirubin Urine: NEGATIVE
GLUCOSE, UA: NEGATIVE mg/dL
Ketones, ur: >80 mg/dL — AB
LEUKOCYTES UA: NEGATIVE
Nitrite: NEGATIVE
Protein, ur: NEGATIVE mg/dL
Urobilinogen, UA: 0.2 mg/dL (ref 0.0–1.0)
pH: 6 (ref 5.0–8.0)

## 2014-08-11 LAB — URINE MICROSCOPIC-ADD ON

## 2014-08-11 MED ORDER — LACTATED RINGERS IV BOLUS (SEPSIS): 1000.0000 mL | Freq: Once | INTRAVENOUS | Status: DC

## 2014-08-11 MED ORDER — DEXTROSE 5 % IN LACTATED RINGERS IV BOLUS
1000.0000 mL | Freq: Once | INTRAVENOUS | Status: AC
Start: 2014-08-11 — End: 2014-08-11
  Administered 2014-08-11: 1000 mL via INTRAVENOUS

## 2014-08-11 NOTE — MAU Provider Note (Signed)
History     CSN: [id number]  Arrival date and time: 08/11/14 1306   None     Chief Complaint  Patient presents with  . Abdominal Pain  . Vaginal Bleeding   HPIpt is 29 yo G4P3 at [redacted]w[redacted]d pregnant presents with spotting in pregnancy-noticed after going to bathroom- dark red- and lower abd cramping mostly on left side Pt was seen on 5/16 with US showing viable IUP 7w0 days with small [facility] and 2 anechoic small cysts on left ovary Pt denies constipation, diarrhea, UTI sx.  Was 2 wks ago, was told normal preg. Past 3 days has had dark almost coffee colored blood- only sees after using restroom. Pain in RLQ, "pinching", sometimes sharp  No past medical history on file.  No past surgical history on file.  No family history on file.  History  Substance Use Topics  . Smoking status: Never Smoker   . Smokeless tobacco: Never Used  . Alcohol Use: No    Allergies: No Known Allergies  Prescriptions prior to admission  Medication Sig Dispense Refill Last Dose  . Prenatal Vit-Fe Fumarate-FA (PRENATAL MULTIVITAMIN) TABS tablet Take 1 tablet by mouth daily at 12 noon.   07/24/2014 at Unknown time    Review of Systems  Constitutional: Negative for fever and chills.  Gastrointestinal: Positive for abdominal pain and constipation. Negative for nausea, vomiting and diarrhea.  Genitourinary: Negative for dysuria and urgency.   Physical Exam   Blood pressure 104/65, pulse 77, temperature 98.3 F (36.8 C), temperature source Oral, resp. rate 16, height  (1.549 m), weight 172 lb (78.019 kg), last menstrual period 05/28/2014.  Physical Exam  Vitals reviewed. Constitutional: She is oriented to person, place, and time. She appears well-developed and well-nourished. No distress.  HENT:  Head: Normocephalic.  Eyes: Pupils are equal, round, and reactive to light.  Neck: Normal range of motion. Neck supple.  Cardiovascular: Normal rate.   Respiratory: Effort normal.  GI: Soft. She  exhibits no distension. There is tenderness. There is no rebound and no guarding.  Genitourinary:  Small amount of dark brown blood in vault; cervix closed, NT; mild right lower quadrant pain with palpation- above adnexa- no rebound  Musculoskeletal: Normal range of motion.  Neurological: She is alert and oriented to person, place, and time.  Skin: Skin is warm and dry.  Psychiatric: She has a normal mood and affect.    MAU Course  Procedures No results found for this or any previous visit (from the past 24 hour(s)). US Ob Comp Less 14 Wks  08/11/2014   CLINICAL DATA:  Bleeding, pain.  EXAM: OBSTETRIC <14 WK [place] AND TRANSVAGINAL OB US  TECHNIQUE: Both transabdominal and transvaginal ultrasound examinations were performed for complete evaluation of the gestation as well as the maternal uterus, adnexal regions, and pelvic cul-de-sac. Transvaginal technique was performed to assess early pregnancy.  COMPARISON:  07/25/2014  FINDINGS: Intrauterine gestational sac: Single  Yolk sac:  Yes  Embryo:  Yes  Cardiac Activity: Yes  Heart Rate: 160  bpm  CRL:  28.1  mm   9 w   5 d                  [place] EDC: 03/11/2015  Maternal uterus/adnexae:  Subchorionic hemorrhage: None  Right ovary: Normal  Left ovary: Normal  Other :None  Free fluid:  None  IMPRESSION: 1. Single living intrauterine gestation. The estimated gestational age is 9 weeks and 5 days.   Electronically Signed  By: Signa Kellaylor  Stroud M.D.   On: 08/11/2014 18:41   [place] Ob Transvaginal  08/11/2014   CLINICAL DATA:  Bleeding, pain.  EXAM: OBSTETRIC <14 WK US AND TRANSVAGINAL OB US  TECHNIQUE: Both transabdominal and transvaginal ultrasound examinations were performed for complete evaluation of the gestation as well as the maternal uterus, adnexal regions, and pelvic cul-de-sac. Transvaginal technique was performed to assess early pregnancy.  COMPARISON:  07/25/2014  FINDINGS: Intrauterine gestational sac: Single  Yolk sac:  Yes  Embryo:  Yes  Cardiac Activity:  Yes  Heart Rate: 160  bpm  CRL:  28.1  mm   9 w   5 d                  US EDC: 03/11/2015  Maternal uterus/adnexae:  Subchorionic hemorrhage: None  Right ovary: Normal  Left ovary: Normal  Other :None  Free fluid:  None  IMPRESSION: 1. Single living intrauterine gestation. The estimated gestational age is 9 weeks and 5 days.   Electronically Signed   By: Signa Kellaylor  Stroud M.D.   On: 08/11/2014 18:41   Results for orders placed or performed during the hospital encounter of 08/11/14 (from the past 24 hour(s))  Urinalysis, Routine w reflex microscopic (not [facility])     Status: Abnormal   Collection Time: 08/11/14  7:05 PM  Result Value Ref Range   Color, Urine YELLOW YELLOW   APPearance CLEAR CLEAR   Specific Gravity, Urine >1.030 (H) 1.005 - 1.030   pH 6.0 5.0 - 8.0   Glucose, UA NEGATIVE NEGATIVE mg/dL   Hgb urine dipstick MODERATE (A) NEGATIVE   Bilirubin Urine NEGATIVE NEGATIVE   Ketones, ur >80 (A) NEGATIVE mg/dL   Protein, ur NEGATIVE NEGATIVE mg/dL   Urobilinogen, UA 0.2 0.0 - 1.0 mg/dL   Nitrite NEGATIVE NEGATIVE   Leukocytes, UA NEGATIVE NEGATIVE  Urine microscopic-add on     Status: Abnormal   Collection Time: 08/11/14  7:05 PM  Result Value Ref Range   Squamous Epithelial / LPF MANY (A) RARE   WBC, UA 0-2 <3 WBC/hpf   RBC / HPF 3-6 <3 RBC/hpf   Bacteria, UA MANY (A) RARE   Urine-Other MUCOUS PRESENT   urine culture pending Delayed urinalysis- revealed pt is dehydrated with >80mg /dL ketones- will give [id number] bolus then LR Care turned over to Vonzella NippleJulie Wenzel, PA  Pamelia HoitLINEBERRY,Tiaira Arambula 08/11/2014, 2:58 PM   Care assumed from Pamelia HoitSusan Abou Sterkel, NP at 2100 Patient fluids complete Assessment and Plan  A: SIUP at 654w3d Vaginal bleeding in pregnancy Dehydration  P: Discharge home First trimester/Bleeding precautions discussed Patient advised to follow-up with OB provider of choice to start prenatal care Patient may return to MAU as needed or if her condition were to change or  worsen  Marny LowensteinJulie N Wenzel, PA-C  08/11/2014 10:22 PM

## 2014-08-11 NOTE — MAU Note (Signed)
Was 2 wks ago, was told normal preg.  Past 3 days has had dark almost coffee colored blood- only sees after using restroom.   Pain in RLQ, "pinching", sometimes sharp

## 2014-08-11 NOTE — Discharge Instructions (Signed)

## 2014-08-12 LAB — CULTURE, OB URINE
COLONY COUNT: NO GROWTH
Culture: NO GROWTH
SPECIAL REQUESTS: NORMAL

## 2014-09-05 ENCOUNTER — Encounter (HOSPITAL_COMMUNITY): Payer: Self-pay

## 2014-09-05 ENCOUNTER — Ambulatory Visit (HOSPITAL_COMMUNITY)
Admission: RE | Admit: 2014-09-05 | Discharge: 2014-09-05 | Disposition: A | Discharge status: Home / Self Care | Payer: Self-pay | Source: Ambulatory Visit | Attending: Nurse Practitioner | Admitting: Nurse Practitioner

## 2014-09-05 ENCOUNTER — Ambulatory Visit (HOSPITAL_COMMUNITY): Admission: RE | Admit: 2014-09-05 | Payer: Self-pay | Source: Ambulatory Visit

## 2014-09-05 DIAGNOSIS — Z3A13 13 weeks gestation of pregnancy: Secondary | ICD-10-CM | POA: Insufficient documentation

## 2014-09-05 DIAGNOSIS — Z36 Encounter for antenatal screening of mother: Secondary | ICD-10-CM | POA: Insufficient documentation

## 2014-09-05 DIAGNOSIS — Z3682 Encounter for antenatal screening for nuchal translucency: Secondary | ICD-10-CM | POA: Insufficient documentation

## 2014-09-05 NOTE — ED Notes (Signed)
Spanish interpreter Viviana SimplerAlis Carney with pt.

## 2014-09-07 ENCOUNTER — Other Ambulatory Visit (HOSPITAL_COMMUNITY): Payer: Self-pay | Admitting: Obstetrics and Gynecology

## 2014-09-07 DIAGNOSIS — Z3682 Encounter for antenatal screening for nuchal translucency: Secondary | ICD-10-CM

## 2014-09-09 ENCOUNTER — Encounter (HOSPITAL_COMMUNITY): Payer: Self-pay

## 2014-09-09 ENCOUNTER — Ambulatory Visit (HOSPITAL_COMMUNITY)
Admission: RE | Admit: 2014-09-09 | Discharge: 2014-09-09 | Disposition: A | Discharge status: Home / Self Care | Payer: Self-pay | Source: Ambulatory Visit | Attending: Nurse Practitioner | Admitting: Nurse Practitioner

## 2014-09-09 ENCOUNTER — Ambulatory Visit (HOSPITAL_COMMUNITY)
Admission: RE | Admit: 2014-09-09 | Discharge: 2014-09-09 | Disposition: A | Discharge status: Home / Self Care | Payer: Self-pay | Source: Ambulatory Visit | Attending: Obstetrics and Gynecology | Admitting: Obstetrics and Gynecology

## 2014-09-09 DIAGNOSIS — Z3A13 13 weeks gestation of pregnancy: Secondary | ICD-10-CM | POA: Insufficient documentation

## 2014-09-09 DIAGNOSIS — Z36 Encounter for antenatal screening of mother: Secondary | ICD-10-CM | POA: Insufficient documentation

## 2014-09-09 DIAGNOSIS — Z3682 Encounter for antenatal screening for nuchal translucency: Secondary | ICD-10-CM | POA: Insufficient documentation

## 2014-09-09 NOTE — ED Notes (Signed)
Spanish interpreter Marlene Yemenorway with pt.

## 2014-09-20 ENCOUNTER — Other Ambulatory Visit (HOSPITAL_COMMUNITY): Payer: Self-pay | Admitting: Nurse Practitioner

## 2014-10-13 ENCOUNTER — Other Ambulatory Visit (HOSPITAL_COMMUNITY): Payer: Self-pay | Admitting: Nurse Practitioner

## 2014-10-13 DIAGNOSIS — Z3689 Encounter for other specified antenatal screening: Secondary | ICD-10-CM

## 2014-10-17 ENCOUNTER — Ambulatory Visit (HOSPITAL_COMMUNITY)
Admission: RE | Admit: 2014-10-17 | Discharge: 2014-10-17 | Disposition: A | Discharge status: Home / Self Care | Payer: Medicaid Other | Source: Ambulatory Visit | Attending: Nurse Practitioner | Admitting: Nurse Practitioner

## 2014-10-17 DIAGNOSIS — Z3689 Encounter for other specified antenatal screening: Secondary | ICD-10-CM

## 2014-10-17 DIAGNOSIS — Z36 Encounter for antenatal screening of mother: Secondary | ICD-10-CM | POA: Insufficient documentation

## 2014-11-21 ENCOUNTER — Other Ambulatory Visit (HOSPITAL_COMMUNITY): Payer: Self-pay | Admitting: Urology

## 2014-11-21 DIAGNOSIS — Z3A24 24 weeks gestation of pregnancy: Secondary | ICD-10-CM

## 2014-11-21 DIAGNOSIS — Z0489 Encounter for examination and observation for other specified reasons: Secondary | ICD-10-CM

## 2014-11-21 DIAGNOSIS — IMO0002 Reserved for concepts with insufficient information to code with codable children: Secondary | ICD-10-CM

## 2014-11-24 ENCOUNTER — Ambulatory Visit (HOSPITAL_COMMUNITY): Payer: Medicaid Other | Attending: Physician Assistant

## 2015-02-22 ENCOUNTER — Inpatient Hospital Stay (HOSPITAL_COMMUNITY)
Admission: AD | Admit: 2015-02-22 | Discharge: 2015-02-24 | DRG: 775 | Disposition: A | Discharge status: Home / Self Care | Payer: Medicaid Other | Source: Ambulatory Visit | Attending: Obstetrics and Gynecology | Admitting: Obstetrics and Gynecology

## 2015-02-22 ENCOUNTER — Encounter (HOSPITAL_COMMUNITY): Payer: Self-pay | Admitting: *Deleted

## 2015-02-22 DIAGNOSIS — O42919 Preterm premature rupture of membranes, unspecified as to length of time between rupture and onset of labor, unspecified trimester: Secondary | ICD-10-CM

## 2015-02-22 DIAGNOSIS — Z3A37 37 weeks gestation of pregnancy: Secondary | ICD-10-CM | POA: Diagnosis not present

## 2015-02-22 DIAGNOSIS — O4290 Premature rupture of membranes, unspecified as to length of time between rupture and onset of labor, unspecified weeks of gestation: Secondary | ICD-10-CM | POA: Diagnosis present

## 2015-02-22 DIAGNOSIS — O4292 Full-term premature rupture of membranes, unspecified as to length of time between rupture and onset of labor: Secondary | ICD-10-CM

## 2015-02-22 DIAGNOSIS — O26893 Other specified pregnancy related conditions, third trimester: Secondary | ICD-10-CM | POA: Diagnosis present

## 2015-02-22 HISTORY — DX: Other specified health status: Z78.9

## 2015-02-22 LAB — CBC
HEMATOCRIT: 33.8 % — AB (ref 36.0–46.0)
HEMOGLOBIN: 11.2 g/dL — AB (ref 12.0–15.0)
MCH: 29 pg (ref 26.0–34.0)
MCHC: 33.1 g/dL (ref 30.0–36.0)
MCV: 87.6 fL (ref 78.0–100.0)
Platelets: 173 10*3/uL (ref 150–400)
RBC: 3.86 MIL/uL — ABNORMAL LOW (ref 3.87–5.11)
RDW: 13.9 % (ref 11.5–15.5)
WBC: 6.1 10*3/uL (ref 4.0–10.5)

## 2015-02-22 LAB — TYPE AND SCREEN
ABO/RH(D): O POS
Antibody Screen: NEGATIVE

## 2015-02-22 LAB — POCT FERN TEST

## 2015-02-22 LAB — OB RESULTS CONSOLE GBS: STREP GROUP B AG: NEGATIVE

## 2015-02-22 LAB — GROUP B STREP BY PCR: Group B strep by PCR: NEGATIVE

## 2015-02-22 MED ORDER — PRENATAL MULTIVITAMIN CH
1.0000 | ORAL_TABLET | Freq: Every day | ORAL | Status: DC
  Administered 2015-02-23 – 2015-02-24 (×2): 1 via ORAL
  Filled 2015-02-22 (×2): qty 1

## 2015-02-22 MED ORDER — IBUPROFEN 600 MG PO TABS
600.0000 mg | ORAL_TABLET | Freq: Four times a day (QID) | ORAL | Status: DC
  Administered 2015-02-23 – 2015-02-24 (×7): 600 mg via ORAL
  Filled 2015-02-22 (×7): qty 1

## 2015-02-22 MED ORDER — LANOLIN HYDROUS EX OINT: TOPICAL_OINTMENT | CUTANEOUS | Status: DC | PRN

## 2015-02-22 MED ORDER — OXYTOCIN BOLUS FROM INFUSION: 500.0000 mL | INTRAVENOUS | Status: DC

## 2015-02-22 MED ORDER — FENTANYL CITRATE (PF) 100 MCG/2ML IJ SOLN
100.0000 ug | INTRAMUSCULAR | Status: DC | PRN
  Administered 2015-02-22: 100 ug via INTRAVENOUS
  Filled 2015-02-22: qty 2

## 2015-02-22 MED ORDER — OXYCODONE-ACETAMINOPHEN 5-325 MG PO TABS: 1.0000 | ORAL_TABLET | ORAL | Status: DC | PRN

## 2015-02-22 MED ORDER — DIPHENHYDRAMINE HCL 25 MG PO CAPS
25.0000 mg | ORAL_CAPSULE | Freq: Four times a day (QID) | ORAL | Status: DC | PRN
Start: 2015-02-22 — End: 2015-02-24

## 2015-02-22 MED ORDER — OXYTOCIN 40 UNITS IN LACTATED RINGERS INFUSION - SIMPLE MED
62.5000 mL/h | INTRAVENOUS | Status: DC
  Administered 2015-02-22: 62.5 mL/h via INTRAVENOUS
  Filled 2015-02-22: qty 1000

## 2015-02-22 MED ORDER — LACTATED RINGERS IV SOLN: 500.0000 mL | INTRAVENOUS | Status: DC | PRN

## 2015-02-22 MED ORDER — WITCH HAZEL-GLYCERIN EX PADS: 1.0000 "application " | MEDICATED_PAD | CUTANEOUS | Status: DC | PRN

## 2015-02-22 MED ORDER — TETANUS-DIPHTH-ACELL PERTUSSIS 5-2.5-18.5 LF-MCG/0.5 IM SUSP
0.5000 mL | Freq: Once | INTRAMUSCULAR | Status: DC
Start: 2015-02-23 — End: 2015-02-23
  Filled 2015-02-22: qty 0.5

## 2015-02-22 MED ORDER — ONDANSETRON HCL 4 MG/2ML IJ SOLN
4.0000 mg | INTRAMUSCULAR | Status: DC | PRN
Start: 2015-02-22 — End: 2015-02-24

## 2015-02-22 MED ORDER — ONDANSETRON HCL 4 MG PO TABS: 4.0000 mg | ORAL_TABLET | ORAL | Status: DC | PRN

## 2015-02-22 MED ORDER — SENNOSIDES-DOCUSATE SODIUM 8.6-50 MG PO TABS
2.0000 | ORAL_TABLET | ORAL | Status: DC
  Administered 2015-02-23 (×2): 2 via ORAL
  Filled 2015-02-22 (×2): qty 2

## 2015-02-22 MED ORDER — OXYCODONE-ACETAMINOPHEN 5-325 MG PO TABS: 2.0000 | ORAL_TABLET | ORAL | Status: DC | PRN

## 2015-02-22 MED ORDER — ACETAMINOPHEN 325 MG PO TABS
650.0000 mg | ORAL_TABLET | ORAL | Status: DC | PRN
Start: 2015-02-22 — End: 2015-02-24

## 2015-02-22 MED ORDER — CITRIC ACID-SODIUM CITRATE 334-500 MG/5ML PO SOLN: 30.0000 mL | ORAL | Status: DC | PRN

## 2015-02-22 MED ORDER — LACTATED RINGERS IV SOLN
INTRAVENOUS | Status: DC
  Administered 2015-02-22: 19:00:00 via INTRAVENOUS

## 2015-02-22 MED ORDER — ZOLPIDEM TARTRATE 5 MG PO TABS: 5.0000 mg | ORAL_TABLET | Freq: Every evening | ORAL | Status: DC | PRN

## 2015-02-22 MED ORDER — BENZOCAINE-MENTHOL 20-0.5 % EX AERO
1.0000 "application " | INHALATION_SPRAY | CUTANEOUS | Status: DC | PRN
  Filled 2015-02-22 (×2): qty 56

## 2015-02-22 MED ORDER — DIBUCAINE 1 % RE OINT
1.0000 "application " | TOPICAL_OINTMENT | RECTAL | Status: DC | PRN
  Filled 2015-02-22: qty 28

## 2015-02-22 MED ORDER — SIMETHICONE 80 MG PO CHEW
80.0000 mg | CHEWABLE_TABLET | ORAL | Status: DC | PRN
Start: 2015-02-22 — End: 2015-02-24

## 2015-02-22 MED ORDER — ONDANSETRON HCL 4 MG/2ML IJ SOLN: 4.0000 mg | Freq: Four times a day (QID) | INTRAMUSCULAR | Status: DC | PRN

## 2015-02-22 MED ORDER — ACETAMINOPHEN 325 MG PO TABS: 650.0000 mg | ORAL_TABLET | ORAL | Status: DC | PRN

## 2015-02-22 MED ORDER — OXYCODONE-ACETAMINOPHEN 5-325 MG PO TABS
1.0000 | ORAL_TABLET | ORAL | Status: DC | PRN
  Administered 2015-02-22: 1 via ORAL
  Filled 2015-02-22: qty 1

## 2015-02-22 MED ORDER — LIDOCAINE HCL (PF) 1 % IJ SOLN
30.0000 mL | INTRAMUSCULAR | Status: DC | PRN
  Administered 2015-02-22: 30 mL via SUBCUTANEOUS
  Filled 2015-02-22: qty 30

## 2015-02-22 NOTE — H&P (Signed)
Erin Carney is a 29 y.o. female G3P2002 @ 37.2 wks by 7wk scan presenting for leaking fluid since 1500. Also w/ regular ctx. [facility] and has been essentially unremarkable.  History OB History    Gravida Para Term Preterm AB TAB SAB Ectopic Multiple Living   3 3 3  0     0 1     Past Medical History  Diagnosis Date  . Medical history non-contributory    Past Surgical History  Procedure Laterality Date  . No past surgeries     Family History: family history is negative for Alcohol abuse, Arthritis, Asthma, Birth defects, Cancer, COPD, Depression, Diabetes, Drug abuse, Early death, Hearing loss, Heart disease, Hyperlipidemia, Hypertension, Kidney disease, Learning disabilities, Mental illness, Mental retardation, Miscarriages / Stillbirths, Stroke, Vision loss, and Varicose Veins. Social History:  reports that she has never smoked. She has never used smokeless tobacco. She reports that she does not drink alcohol or use illicit drugs.   Prenatal Transfer Tool  Maternal Diabetes: No Genetic Screening: Normal Maternal Ultrasounds/Referrals: Normal Fetal Ultrasounds or other Referrals:  None Maternal Substance Abuse:  No Significant Maternal Medications:  None Significant Maternal Lab Results:  Lab values include: Group B Strep negative Other Comments:  None  ROS  Cx on admission: 3/80/vtx  Blood pressure 122/62, pulse 79, temperature 97.8 F (36.6 C), temperature source Oral, last menstrual period 05/28/2014, unknown if currently breastfeeding. Exam Physical Exam  Constitutional: She is oriented to person, place, and time. She appears well-developed.  HENT:  Head: Normocephalic.  Neck: Normal range of motion.  Cardiovascular: Normal rate.   Respiratory: Effort normal.  GI:  EFM 130s, +accels, no decels Ctx q 4-5 mins, spont  Musculoskeletal: Normal range of motion.  Neurological: She is alert and oriented to person, place, and time.   Skin: Skin is warm and dry.  Psychiatric: She has a normal mood and affect. Her behavior is normal. Thought content normal.    Prenatal labs: ABO, Rh: --/--/O POS (12/14 1910) Antibody: NEG (12/14 1910) Rubella: Nonimmune (05/26 0000) RPR: Non Reactive (05/16 1420)  HBsAg: Negative (05/26 0000)  HIV: Non Reactive (05/16 1420)  GBS: Negative (12/14 0000)   Assessment/Plan: IUP@37 .2wks SROM/labor GBS neg  [facility] management Anticipate SVD   Cam HaiSHAW, KIMBERLY CNM 02/22/2015, 10:59 PM

## 2015-02-22 NOTE — Progress Notes (Signed)
Patient ID: Erin Carney, female   DOB: 1985-07-19, 29 y.o.   MRN: [id number] Delivery Note At  a viable and healthy female was delivered via  (Presentation: ;  ).  APGAR: , ; weight  .   Placenta status: , .  Cord:  with the following complications: .  Cord pH: not done  Anesthesia:  local Episiotomy:  none Lacerations:  2 nd degree Suture Repair: 2.0 vicryl Est. Blood Loss (mL):  500  Mom to postpartum.  Baby to Couplet care / Skin to Skin.  Caydan Mctavish V 02/22/2015, 10:07 PM

## 2015-02-22 NOTE — MAU Note (Signed)
C/o ? SROM around 1700 this afternoon and ucs since 1800 this afternoon;

## 2015-02-22 NOTE — MAU Note (Signed)
Urine in lab 

## 2015-02-23 ENCOUNTER — Telehealth (HOSPITAL_COMMUNITY): Payer: Self-pay | Admitting: *Deleted

## 2015-02-23 LAB — CBC
HEMATOCRIT: 30.1 % — AB (ref 36.0–46.0)
Hemoglobin: 10.2 g/dL — ABNORMAL LOW (ref 12.0–15.0)
MCH: 29.6 pg (ref 26.0–34.0)
MCHC: 33.9 g/dL (ref 30.0–36.0)
MCV: 87.2 fL (ref 78.0–100.0)
Platelets: 154 10*3/uL (ref 150–400)
RBC: 3.45 MIL/uL — ABNORMAL LOW (ref 3.87–5.11)
RDW: 13.8 % (ref 11.5–15.5)
WBC: 9 10*3/uL (ref 4.0–10.5)

## 2015-02-23 LAB — RPR: RPR: NONREACTIVE

## 2015-02-23 MED ORDER — MEDROXYPROGESTERONE ACETATE 150 MG/ML IM SUSP
150.0000 mg | Freq: Once | INTRAMUSCULAR | Status: DC
  Filled 2015-02-23: qty 1

## 2015-02-23 NOTE — Progress Notes (Addendum)
Post Partum Day 1 Subjective:  Erin Carney is a 29 y.o. G3P3001 5656w2d s/p SVD.  No acute events overnight.  Pt denies problems with ambulating, voiding or po intake.  She denies nausea or vomiting.  Pain is well controlled.  She has had flatus.  Lochia Minimal.  Plan for birth control is Depo-Provera.  Method of Feeding: Breast and bottle.   Objective: Blood pressure 107/63, pulse 84, temperature 98.4 F (36.9 C), temperature source Oral, resp. rate 18, last menstrual period 05/28/2014, SpO2 99 %, unknown if currently breastfeeding.  Physical Exam:  General: alert, cooperative and no distress Lochia:normal flow Chest: normal WOB Heart: Regular rate Abdomen: +BS, soft, mild TTP (appropriate) Uterine Fundus: firm, below umbillicus DVT Evaluation: No evidence of DVT seen on physical exam. Extremities: No edema   Recent Labs  02/22/15 1910 02/23/15 0555  HGB 11.2* 10.2*  HCT 33.8* 30.1*    Assessment/Plan:  ASSESSMENT: Erin Carney is a 29 y.o. G3P3001 2956w2d s/p SVD, uncomplicated.   Plan for discharge tomorrow  Consider Depo injection prior to discharge.  Continue routine PP care Breastfeeding support PRN  LOS: 1 day   Olivia Cantermanda Schultz 02/23/2015, 9:06 AM   CNM attestation Post Partum Day #1 I have seen and examined this patient and agree with above documentation in the residnet's note.   Erin Carney is a 29 y.o. G3P3001 s/p SVD.  Pt denies problems with ambulating, voiding or po intake. Pain is well controlled.  Plan for birth control is Depo-Provera.  Method of Feeding: breast & bottle  PE:  BP 112/74 mmHg  Pulse 62  Temp(Src) 97.5 F (36.4 C) (Oral)  Resp 20  SpO2 99%  LMP 05/28/2014 (Exact Date)  Breastfeeding? Unknown Fundus firm  Plan for discharge: 02/24/15  Cam HaiSHAW, Tyrene Nader, CNM 12:33 AM

## 2015-02-23 NOTE — Progress Notes (Signed)
UR chart review completed.  

## 2015-02-23 NOTE — Progress Notes (Signed)
I assisted Windell MouldingRuth from [employer] with some information , by Orlan LeavensViria Alvarez Spanish Interpreter

## 2015-02-23 NOTE — Progress Notes (Signed)
I assisted  Pt with some questions , I was present during the delivery with Dr Emelda FearFerguson, by Orlan LeavensViria Alvarez Spanish Interpreter.

## 2015-02-23 NOTE — Progress Notes (Signed)
I assisted Resident  Veatrice KellsShultz with some questions , by Orlan LeavensViria Alvarez Spanish Interpreter.

## 2015-02-23 NOTE — Lactation Note (Signed)
This note was copied from the chart of Erin Carney. Lactation Consultation Note  Spanish interpreter present. P3, BF first child for 1 year 3 months and 2nd child for 3 months. Mother states she knows how to hand express and has viewed drops. Offered to help her latch with next feeding.  Suggest she call. Discussed depth, cluster feeding.  Mom encouraged to feed baby 8-12 times/24 hours and with feeding cues.  Mom made aware of O/P services, breastfeeding support groups, community resources, and our phone # for post-discharge questions in Spanish.     Patient Name: Erin Waynetta SandyDeysi Moran Carney IONGE'XToday's Date: 02/23/2015 Reason for consult: Initial assessment   Maternal Data    Feeding Feeding Type: Formula Nipple Type: Slow - flow  LATCH Score/Interventions                      Lactation Tools Discussed/Used     Consult Status Consult Status: Follow-up Date: 02/23/15 Follow-up type: In-patient    Dahlia ByesBerkelhammer, Dameian Crisman [facility] 02/23/2015, 10:28 AM

## 2015-02-23 NOTE — Progress Notes (Signed)
I stopped to ck on patien's need I ordered her breakfast, by Orlan LeavensViria Alvarez Spanish  Interpreter

## 2015-02-24 DIAGNOSIS — O42919 Preterm premature rupture of membranes, unspecified as to length of time between rupture and onset of labor, unspecified trimester: Secondary | ICD-10-CM

## 2015-02-24 NOTE — Discharge Instructions (Signed)
Parto vaginal, Cuidados posteriores  °(Vaginal Delivery, Care After) °Siga estas instrucciones durante las próximas semanas. Estas indicaciones para el alta le proporcionan información general acerca de cómo deberá cuidarse después del parto. El médico también podrá darle instrucciones específicas. El tratamiento ha sido planificado según las prácticas médicas actuales, pero en algunos casos pueden ocurrir problemas. Comuníquese con el médico si tiene algún problema o tiene preguntas al volver a su casa.  °INSTRUCCIONES PARA EL CUIDADO EN EL HOGAR  °· Tome sólo medicamentos de venta libre o recetados, según las indicaciones del médico o del farmacéutico. °· No beba alcohol, especialmente si está amamantando o toma analgésicos. °· No mastique tabaco ni fume. °· No consuma drogas. °· Continúe con un adecuado cuidado perineal. El buen cuidado perineal incluye: °¨ Higienizarse de adelante hacia atrás. °¨ Mantener la zona perineal limpia. °· No use tampones ni duchas vaginales hasta que su médico la autorice. °· Dúchese, lávese el cabello y tome baños de inmersión según las indicaciones de su médico. °· Utilice un sostén que le ajuste bien y que brinde buen soporte a sus mamas. °· Consuma alimentos saludables. °· Beba suficiente líquido para mantener la orina clara o de color amarillo pálido. °· Consuma alimentos ricos en fibra como cereales y panes integrales, arroz, frijoles y frutas y verduras frescas todos los días. Estos alimentos pueden ayudarla a prevenir o aliviar el estreñimiento. °· Siga las recomendaciones de su médico relacionadas con la reanudación de actividades como subir escaleras, conducir automóviles, levantar objetos, hacer ejercicios o viajar. °· Hable con su médico acerca de reanudar la actividad sexual. Volver a la actividad sexual depende del riesgo de infección, la velocidad de la curación y la comodidad y su deseo de reanudarla. °· Trate de que alguien la ayude con las actividades del hogar y con  el recién nacido al menos durante un par de días después de salir del hospital. °· Descanse todo lo que pueda. Trate de descansar o tomar una siesta mientras el bebé está durmiendo. °· Aumente sus actividades gradualmente. °· Cumpla con todas las visitas de control programadas para después del parto. Es muy importante asistir a todas las citas programadas de seguimiento. En estas citas, su médico va a controlarla para asegurarse de que esté sanando física y emocionalmente. °SOLICITE ATENCIÓN MÉDICA SI:  °· Elimina coágulos grandes por la vagina. Guarde algunos coágulos para mostrarle al médico. °· Tiene una secreción con feo olor que proviene de la vagina. °· Tiene dificultad para orinar. °· Orina con frecuencia. °· Siente dolor al orinar. °· Nota un cambio en sus movimientos intestinales. °· Aumenta el enrojecimiento, el dolor o la hinchazón en la zona de la incisión vaginal (episiotomía) o el desgarro vaginal. °· Tiene pus que drena por la episiotomía o el desgarro vaginal. °· La episiotomía o el desgarro vaginal se abren. °· Sus mamas le duelen, están duras o enrojecidas. °· Sufre un dolor intenso de cabeza. °· Tiene visión borrosa o ve manchas. °· Se siente triste o deprimida. °· Tiene pensamientos acerca de lastimarse o dañar al recién nacido. °· Tiene preguntas acerca de su cuidado personal, el cuidado del recién nacido o acerca de los medicamentos. °· Se siente mareada o sufre un desmayo. °· Tiene una erupción. °· Tiene náuseas o vómitos. °· Usted amamantó al bebé y no ha tenido su período menstrual dentro de las 12 semanas después de dejar de amamantar. °· No amamanta al bebé y no tuvo su período menstrual en las últimas 12° semanas después del   parto. °· Tiene fiebre. °SOLICITE ATENCIÓN MÉDICA DE INMEDIATO SI:  °· Siente dolor persistente. °· Siente dolor en el pecho. °· Le falta el aire. °· Se desmaya. °· Siente dolor en la pierna. °· Siente dolor en el estómago. °· El sangrado vaginal satura dos o más  apósitos en 1 hora. °  °Esta información no tiene como fin reemplazar el consejo del médico. Asegúrese de hacerle al médico cualquier pregunta que tenga. °  °Document Released: 02/25/2005 Document Revised: 11/16/2014 °Elsevier Interactive Patient Education ©2016 Elsevier Inc. ° °

## 2015-02-24 NOTE — Progress Notes (Signed)
I assisted Dr Adrian BlackwaterStinson with some discharge information, and i ordered meals, by Orlan LeavensViria Alvarez Spanish Interpreter.

## 2015-02-24 NOTE — Lactation Note (Addendum)
This note was copied from the chart of Erin Carney. Lactation Consultation Note  Patient Name: Erin Waynetta SandyDeysi Moran Carney ZOXWR'UToday's Date: 02/24/2015 Reason for consult: Follow-up assessment   Follow up with exp BF mom prior to D/C. Spoke with mom with hospital interpreter V. Onalee HuaAlvarez. Infant with 7 Bf for 10-20 minutes, 4 bottle feeds of formula of 7-15 cc, 4 voids and 2 stools in last 24 hours. Infant weighs 8 lb   oz with weight loss of 5%. LATCH Scores are 8 per bedside RN. INfant born at 9637 w 2 d GA and is now 138 hours old. Infant cueing to feed, mom said he fed around 11:30 for 20 minutes. Enc mom to put baby back to breast. Mom with large diameter everted nipples and soft breasts. Mom latched infant to left breast in cradle hold, infant noted to be sucking mainly on nipple and mom reports that it was hurting, assisted her in relatching infant deeper and pain diminished. Infant with wide open mouth and latched easily, he did required stimulation to maintain suckling, no swallows were noted. Mom was giving baby breathing space, encouraged her not to do so and explained to her how infant able to breathe while still maintaining deep latch.  Dr. Margo AyeHall requested that supplementation continue for this baby until at least Monday when he has follow up with his Ped who will decide if supplementation can be D/C'd. Advised mom to BF infant 8-12 x in 24 hours at first feeding cues for a minimum of 15-20 minutes providing stimulation as needed to keep suckling. Follow BF by EBM or formula supplementation. Reviewed engorgement prevention/treatment. Gave mom hand pump with instructions for use. Reviewed all information in Taking Care of Baby and Me Booklet. Enc mom to call with questions/concerns.    Maternal Data Formula Feeding for Exclusion: No Does the patient have breastfeeding experience prior to this delivery?: Yes  Feeding Feeding Type: Breast Fed Length of feed: 10 min  LATCH  Score/Interventions Latch: Repeated attempts needed to sustain latch, nipple held in mouth throughout feeding, stimulation needed to elicit sucking reflex. Intervention(s): Assist with latch;Breast massage;Breast compression;Adjust position  Audible Swallowing: None Intervention(s): Skin to skin  Type of Nipple: Everted at rest and after stimulation  Comfort (Breast/Nipple): Soft / non-tender     Hold (Positioning): Assistance needed to correctly position infant at breast and maintain latch. Intervention(s): Breastfeeding basics reviewed;Support Pillows;Position options;Skin to skin  LATCH Score: 6  Lactation Tools Discussed/Used WIC Program: Yes   Consult Status Consult Status: Complete Follow-up type: Call as needed    Ed BlalockSharon S Sema Stangler 02/24/2015, 12:16 PM

## 2015-02-24 NOTE — Discharge Summary (Signed)
    OB Discharge Summary     Patient Name: Erin SandyDeysi Moran Guillen DOB: November 10, 1985 MRN: [id number]  Date of admission: 02/22/2015 Delivering MD: Tilda BurrowFERGUSON, JOHN V   Date of discharge: 02/24/2015  Admitting diagnosis: 37w water broke Intrauterine pregnancy: 1911w2d     Secondary diagnosis:  Active Problems:   Second-degree perineal laceration, with delivery   Status post normal vaginal delivery  Additional problems: none     Discharge diagnosis: Term Pregnancy Delivered                                                                                                Post partum procedures:none  Augmentation: none  Complications: None  Hospital course:  Onset of Labor With Vaginal Delivery     29 y.o. yo G3P3001 at 2011w2d was admitted in Active Laboron 02/22/2015. Patient had an uncomplicated labor course as follows:  Membrane Rupture Time/Date: 5:00 PM ,02/22/2015   Intrapartum Procedures: Episiotomy: None [1]                                         Lacerations:  2nd degree [3]  Patient had a delivery of a Viable infant. 02/22/2015  Information for the patient's newborn:  Renold DonMoran Guillen, Boy [facility] [[id number]][[id number]]       Pateint had an uncomplicated postpartum course.  She is ambulating, tolerating a regular diet, passing flatus, and urinating well. Patient is discharged home in stable condition on No discharge date for patient encounter.Marland Kitchen.    Physical exam  Filed Vitals:   02/23/15 0415 02/23/15 1314 02/23/15 1744 02/24/15 0546  BP: 107/63 99/60 114/67 112/74  Pulse: 84 76 68 62  Temp: 98.4 F (36.9 C) 98.1 F (36.7 C) 97.3 F (36.3 C) 97.5 F (36.4 C)  TempSrc: Oral Oral Oral   Resp: 18 20 18 20   SpO2: 99%      General: alert, cooperative and no distress Lochia: appropriate Uterine Fundus: firm DVT Evaluation: No evidence of DVT seen on physical exam. Negative Homan's sign. Labs: Lab Results  Component Value Date   WBC 9.0 02/23/2015   HGB 10.2* 02/23/2015   HCT 30.1*  02/23/2015   MCV 87.2 02/23/2015   PLT 154 02/23/2015   No flowsheet data found.  Discharge instruction: per After Visit Summary and "Baby and Me Booklet".  After visit meds:    Medication List    TAKE these medications        prenatal multivitamin Tabs tablet  Take 1 tablet by mouth daily at 12 noon.        Diet: routine diet  Activity: Advance as tolerated. Pelvic rest for 6 weeks.   Outpatient follow up:6 weeks Follow up Appt:No future appointments. Follow up Visit:No Follow-up on file.  Postpartum contraception: Progesterone only pills  Newborn Data: Live born female  Birth Weight: 8 lb 7.1 oz (3830 g) APGAR: 7, 9  Baby Feeding: Breast Disposition:home with mother   02/24/2015 Candelaria CelesteSTINSON, Meighan Treto JEHIEL, DO

## 2015-08-10 NOTE — Telephone Encounter (Signed)
Erroneous enounter

## 2016-11-30 ENCOUNTER — Encounter (HOSPITAL_COMMUNITY): Payer: Self-pay | Admitting: *Deleted

## 2016-11-30 ENCOUNTER — Inpatient Hospital Stay (HOSPITAL_COMMUNITY)
Admission: AD | Admit: 2016-11-30 | Discharge: 2016-12-01 | Disposition: A | Discharge status: Home / Self Care | Payer: Self-pay | Source: Ambulatory Visit | Attending: Obstetrics & Gynecology | Admitting: Obstetrics & Gynecology

## 2016-11-30 ENCOUNTER — Inpatient Hospital Stay (HOSPITAL_COMMUNITY): Payer: Self-pay

## 2016-11-30 DIAGNOSIS — Z3A09 9 weeks gestation of pregnancy: Secondary | ICD-10-CM | POA: Insufficient documentation

## 2016-11-30 DIAGNOSIS — O26891 Other specified pregnancy related conditions, first trimester: Secondary | ICD-10-CM | POA: Insufficient documentation

## 2016-11-30 DIAGNOSIS — M549 Dorsalgia, unspecified: Secondary | ICD-10-CM | POA: Insufficient documentation

## 2016-11-30 DIAGNOSIS — Z3491 Encounter for supervision of normal pregnancy, unspecified, first trimester: Secondary | ICD-10-CM

## 2016-11-30 DIAGNOSIS — O26841 Uterine size-date discrepancy, first trimester: Secondary | ICD-10-CM

## 2016-11-30 DIAGNOSIS — O99891 Other specified diseases and conditions complicating pregnancy: Secondary | ICD-10-CM

## 2016-11-30 DIAGNOSIS — O209 Hemorrhage in early pregnancy, unspecified: Secondary | ICD-10-CM | POA: Insufficient documentation

## 2016-11-30 DIAGNOSIS — O9989 Other specified diseases and conditions complicating pregnancy, childbirth and the puerperium: Secondary | ICD-10-CM

## 2016-11-30 LAB — WET PREP, GENITAL
Clue Cells Wet Prep HPF POC: NONE SEEN
Sperm: NONE SEEN
TRICH WET PREP: NONE SEEN
YEAST WET PREP: NONE SEEN

## 2016-11-30 LAB — URINALYSIS, ROUTINE W REFLEX MICROSCOPIC
BILIRUBIN URINE: NEGATIVE
Bacteria, UA: NONE SEEN
Glucose, UA: NEGATIVE mg/dL
Ketones, ur: NEGATIVE mg/dL
LEUKOCYTES UA: NEGATIVE
NITRITE: NEGATIVE
PH: 6 (ref 5.0–8.0)
Protein, ur: NEGATIVE mg/dL
SPECIFIC GRAVITY, URINE: 1.019 (ref 1.005–1.030)

## 2016-11-30 NOTE — MAU Note (Signed)
Pt stated she has been having pain and frequency when she urinates.since Thursday. Having pain in her lower right back and side. When she urinates only a little comes out. Took some tylenol for pain and then noticed some blood when she was urinating and blood when she wiped.. Denies any fever.

## 2016-11-30 NOTE — MAU Note (Signed)
Having pain in lower back and abd since yesterday. Some vag bleeding tonight

## 2016-11-30 NOTE — MAU Provider Note (Signed)
Chief Complaint: Back Pain; Abdominal Pain; and Vaginal Bleeding  First Provider Initiated Contact with Patient 11/30/16 2331      SUBJECTIVE HPI: Erin Carney is a 31 y.o. G4P3003 at [redacted]w[redacted]d who presents to Maternity Admissions reporting: Seeing few drops of blood when she urinated today and right low back/hip pain since yesterday that radiated down her right leg and briefly made if difficult for her to bear weight. Back pain has persisted, but other pain and associated Sx resolved spontaneously. Unsure of blood was from urethra or vagina.   No testing this pregnancy. Has NOB scheduled at [facility] Dept.   Vaginal Bleeding: Uncertain Passage of tissue or clots: None Dizziness: None  O POS  Pain Location: right low back and hip Quality: sharp Severity: 6/10 on pain scale Duration: <24 hours Course: improving Context: Early pregnancy. IUP not confirmed Timing: Constant  Modifying factors: None. Hasn't tried anything for the pain.  Associated signs and symptoms: Pos for feeling as if she has to push to urinate and urinary frequency. Questionable hematuria vs vaginal bleeding. Neg for fever, chills, flank pain, N/V, leg numbness.   Past Medical History:  Diagnosis Date  . Medical history non-contributory    OB History  Gravida Para Term Preterm AB Living  0   3  SAB TAB Ectopic Multiple Live Births        0 3    # Outcome Date GA Lbr Len/2nd Weight Sex Delivery Anes PTL Lv  4 Current           3 Term 02/22/15 [redacted]w[redacted]d 04:45 / 00:09 8 lb 7.1 oz (3.83 kg) M Vag-Spont None  LIV  2 Term         LIV  1 Term         LIV     Past Surgical History:  Procedure Laterality Date  . NO PAST SURGERIES     Social History   Social History  . Marital status: Single    Spouse name: N/A  . Number of children: N/A  . Years of education: N/A   Occupational History  . Not on file.   Social History Main Topics  . Smoking status: Never Smoker  . Smokeless tobacco:  Never Used  . Alcohol use No  . Drug use: No  . Sexual activity: Yes    Birth control/ protection: None   Other Topics Concern  . Not on file   Social History Narrative  . No narrative on file   No current facility-administered medications on file prior to encounter.    Current Outpatient Prescriptions on File Prior to Encounter  Medication Sig Dispense Refill  . Prenatal Vit-Fe Fumarate-FA (PRENATAL MULTIVITAMIN) TABS tablet Take 1 tablet by mouth daily at 12 noon.     No Known Allergies  I have reviewed the past Medical Hx, Surgical Hx, Social Hx, Allergies and Medications.   Review of Systems  Constitutional: Negative for chills and fever.  Gastrointestinal: Negative for abdominal pain, anal bleeding, blood in stool, constipation, diarrhea, nausea and vomiting.  Genitourinary: Positive for difficulty urinating, frequency, hematuria (questionable) and vaginal bleeding (questionable). Negative for dysuria, flank pain, pelvic pain, urgency and vaginal discharge.  Musculoskeletal: Positive for back pain and gait problem.    OBJECTIVE Patient Vitals for the past 24 hrs:  BP Temp Pulse Resp Height Weight  11/30/16 2244 110/63 98.2 F (36.8 C) 73 18 5' 1.5" (1.562 m) 179 lb (81.2 kg)   Constitutional:  Well-developed, well-nourished female in no acute distress.  Cardiovascular: normal rate Respiratory: normal rate and effort.  GI: Abd soft, non-tender. Pos BS x 4 MS: Extremities nontender, no edema, normal ROM Neurologic: Alert and oriented x 4.  GU: Neg CVAT.  SPECULUM EXAM: NEFG, physiologic discharge, small amount of dark red blood noted, cervix clean, multiparous-looking  BIMANUAL: cervix closed; uterus slightly enlarged, no adnexal tenderness or masses. No CMT.  LAB RESULTS Results for orders placed or performed during the hospital encounter of 11/30/16 (from the past 24 hour(s))  Urinalysis, Routine w reflex microscopic     Status: Abnormal   Collection Time: 11/30/16  10:50 PM  Result Value Ref Range   Color, Urine YELLOW YELLOW   APPearance HAZY (A) CLEAR   Specific Gravity, Urine 1.019 1.005 - 1.030   pH 6.0 5.0 - 8.0   Glucose, UA NEGATIVE NEGATIVE mg/dL   Hgb urine dipstick MODERATE (A) NEGATIVE   Bilirubin Urine NEGATIVE NEGATIVE   Ketones, ur NEGATIVE NEGATIVE mg/dL   Protein, ur NEGATIVE NEGATIVE mg/dL   Nitrite NEGATIVE NEGATIVE   Leukocytes, UA NEGATIVE NEGATIVE   RBC / HPF 0-5 0 - 5 RBC/hpf   WBC, UA 0-5 0 - 5 WBC/hpf   Bacteria, UA NONE SEEN NONE SEEN   Squamous Epithelial / LPF 0-5 (A) NONE SEEN   Mucus PRESENT   Wet prep, genital     Status: Abnormal   Collection Time: 11/30/16 11:20 PM  Result Value Ref Range   Yeast Wet Prep HPF POC NONE SEEN NONE SEEN   Trich, Wet Prep NONE SEEN NONE SEEN   Clue Cells Wet Prep HPF POC NONE SEEN NONE SEEN   WBC, Wet Prep HPF POC MODERATE (A) NONE SEEN   Sperm NONE SEEN     IMAGING US Ob Comp Less 14 Wks  Result Date: 12/01/2016 CLINICAL DATA:  Vaginal bleeding with abdominal pain EXAM: OBSTETRIC <14 WK [place] AND TRANSVAGINAL OB US TECHNIQUE: Both transabdominal and transvaginal ultrasound examinations were performed for complete evaluation of the gestation as well as the maternal uterus, adnexal regions, and pelvic cul-de-sac. Transvaginal technique was performed to assess early pregnancy. COMPARISON:  None. FINDINGS: Intrauterine gestational sac: Single intrauterine gestational sac Yolk sac:  Visible Embryo:  Visible Cardiac Activity: Visible Heart Rate: 127  bpm CRL:  7.3  mm   6 w   4 d                  [place] EDC: 07/22/2017 Subchorionic hemorrhage:  None visualized. Maternal uterus/adnexae: Ovaries are within normal limits. The left ovary measures 1.9 x 3.3 x 1.6 cm. The right ovary measures 4.7 x 2.6 x 2.9 cm and contains a corpus luteal cyst. No significant free fluid IMPRESSION: Single viable intrauterine pregnancy as above. There are no specific abnormalities visualized Electronically Signed   By:  Jasmine Pang M.D.   On: 12/01/2016 00:15   US Ob Transvaginal  Result Date: 12/01/2016 CLINICAL DATA:  Vaginal bleeding with abdominal pain EXAM: OBSTETRIC <14 WK [place] AND TRANSVAGINAL OB US TECHNIQUE: Both transabdominal and transvaginal ultrasound examinations were performed for complete evaluation of the gestation as well as the maternal uterus, adnexal regions, and pelvic cul-de-sac. Transvaginal technique was performed to assess early pregnancy. COMPARISON:  None. FINDINGS: Intrauterine gestational sac: Single intrauterine gestational sac Yolk sac:  Visible Embryo:  Visible Cardiac Activity: Visible Heart Rate: 127  bpm CRL:  7.3  mm   6 w   4 d  [place] EDC: 07/22/2017 Subchorionic hemorrhage:  None visualized. Maternal uterus/adnexae: Ovaries are within normal limits. The left ovary measures 1.9 x 3.3 x 1.6 cm. The right ovary measures 4.7 x 2.6 x 2.9 cm and contains a corpus luteal cyst. No significant free fluid IMPRESSION: Single viable intrauterine pregnancy as above. There are no specific abnormalities visualized Electronically Signed   By: Jasmine Pang M.D.   On: 12/01/2016 00:15    MAU COURSE CBC, Quant, ABO/Rh, ultrasound, wet prep and GC/chlamydia culture, UA, Urine culture.  MDM - Pain and bleeding in early pregnancy with normal intrauterine pregnancy and hemodynamically stable. Pain likely from [facility]. UA not C/W infection. Will send culture due to vague urinary complaints.  - S<D. Change EDD.  ASSESSMENT 1. Vaginal bleeding in pregnancy, first trimester   2. Back pain affecting pregnancy in first trimester    PLAN Discharge home in stable condition. Bleeding precautions Follow-up Information    Department, [facility] Follow up.   Why:  for New OB visit as scheduled Contact information: [address] [place] [address] [phone]        THE [facility] [place] MATERNITY ADMISSIONS Follow up.   Why:  in pregnancy  emergencies Contact information: [address] [id number] mc [place] [address] [phone]         PNV   Katrinka Blazing [place], [place] 12/01/2016  12:24 AM  4

## 2016-12-01 DIAGNOSIS — O26891 Other specified pregnancy related conditions, first trimester: Secondary | ICD-10-CM

## 2016-12-01 DIAGNOSIS — O209 Hemorrhage in early pregnancy, unspecified: Secondary | ICD-10-CM

## 2016-12-01 DIAGNOSIS — O26841 Uterine size-date discrepancy, first trimester: Secondary | ICD-10-CM

## 2016-12-01 DIAGNOSIS — M549 Dorsalgia, unspecified: Secondary | ICD-10-CM

## 2016-12-01 LAB — HIV ANTIBODY (ROUTINE TESTING W REFLEX): HIV SCREEN 4TH GENERATION: NONREACTIVE

## 2016-12-01 LAB — CBC
HEMATOCRIT: 36.7 % (ref 36.0–46.0)
Hemoglobin: 12.9 g/dL (ref 12.0–15.0)
MCH: 30.8 pg (ref 26.0–34.0)
MCHC: 35.1 g/dL (ref 30.0–36.0)
MCV: 87.6 fL (ref 78.0–100.0)
PLATELETS: 214 10*3/uL (ref 150–400)
RBC: 4.19 MIL/uL (ref 3.87–5.11)
RDW: 13.1 % (ref 11.5–15.5)
WBC: 6.3 10*3/uL (ref 4.0–10.5)

## 2016-12-01 LAB — HCG, QUANTITATIVE, PREGNANCY: HCG, BETA CHAIN, QUANT, S: 66193 m[IU]/mL — AB (ref <5)

## 2016-12-01 NOTE — Discharge Instructions (Signed)
Hemorragia vaginal durante el embarazo (primer trimestre) °(Vaginal Bleeding During Pregnancy, First Trimester) °Durante los primeros meses del embarazo es relativamente frecuente que se presente una pequeña hemorragia (manchas). Esta situación generalmente mejora por sí misma. Estas hemorragias o manchas tienen diversas causas al inicio del embarazo. Algunas manchas pueden estar relacionadas al embarazo y otras no. En la mayoría de los casos, la hemorragia es normal y no es un problema. Sin embargo, la hemorragia también puede ser un signo de algo grave. Debe informar a su médico de inmediato si tiene alguna hemorragia vaginal. °Algunas causas posibles de hemorragia vaginal durante el primer trimestre incluyen: °· Infección o inflamación del cuello del útero. °· Crecimientos anormales (pólipos) en el cuello del útero. °· Aborto espontáneo o amenaza de aborto espontáneo. °· Tejido del embarazo se ha desarrollado fuera del útero y en una trompa de falopio (embarazo ectópico). °· Se han desarrollado pequeños quistes en el útero en lugar de tejido de embarazo (embarazo molar). °INSTRUCCIONES PARA EL CUIDADO EN EL HOGAR °Controle su afección para ver si hay cambios. Las siguientes indicaciones ayudarán a aliviar cualquier molestia que pueda sentir: °· Siga las indicaciones del médico para restringir su actividad. Si el médico le indica descanso en la cama, debe quedarse en la cama y levantarse solo para ir al baño. No obstante, el médico puede permitirle que continué con tareas livianas. °· Si es necesario, organícese para que alguien le ayude con las actividades y responsabilidades cotidianas mientras está en cama. °· Lleve un registro de la cantidad y la saturación de las toallas higiénicas que utiliza cada día. Anote este dato. °· No use tampones.No se haga duchas vaginales. °· No tenga relaciones sexuales u orgasmos hasta que el médico la autorice. °· Si elimina tejido por la vagina, guárdelo para mostrárselo al  médico. °· Tome solo medicamentos de venta libre o recetados, según las indicaciones del médico. °· No tome aspirina, ya que puede causar hemorragias. °· Cumpla con todas las visitas de control, según le indique su médico. °SOLICITE ATENCIÓN MÉDICA SI: °· Tiene un sangrado vaginal en cualquier momento del embarazo. °· Tiene calambres o dolores de parto. °· Tiene fiebre que los medicamentos no pueden controlar. °SOLICITE ATENCIÓN MÉDICA DE INMEDIATO SI: °· Siente calambres intensos en la espalda o en el vientre (abdomen). °· Elimina coágulos grandes o tejido por la vagina. °· La hemorragia aumenta. °· Si se siente mareada, débil o se desmaya. °· Tiene escalofríos. °· Tiene una pérdida importante o sale líquido a borbotones por la vagina. °· Se desmaya mientras defeca. °ASEGÚRESE DE QUE: °· Comprende estas instrucciones. °· Controlará su afección. °· Recibirá ayuda de inmediato si no mejora o si empeora. °Esta información no tiene como fin reemplazar el consejo del médico. Asegúrese de hacerle al médico cualquier pregunta que tenga. °Document Released: 12/05/2004 Document Revised: 03/02/2013 Document Reviewed: 11/02/2012 °Elsevier Interactive Patient Education © 2018 Elsevier Inc. ° °

## 2016-12-02 LAB — GC/CHLAMYDIA PROBE AMP (~~LOC~~) NOT AT ARMC
CHLAMYDIA, DNA PROBE: NEGATIVE
NEISSERIA GONORRHEA: NEGATIVE

## 2016-12-03 LAB — CULTURE, OB URINE
Culture: 20000 — AB
SPECIAL REQUESTS: NORMAL

## 2016-12-06 ENCOUNTER — Inpatient Hospital Stay (HOSPITAL_COMMUNITY)
Admission: AD | Admit: 2016-12-06 | Discharge: 2016-12-06 | Disposition: A | Discharge status: Home / Self Care | Payer: Self-pay | Source: Ambulatory Visit | Attending: Obstetrics & Gynecology | Admitting: Obstetrics & Gynecology

## 2016-12-06 ENCOUNTER — Encounter (HOSPITAL_COMMUNITY): Payer: Self-pay

## 2016-12-06 DIAGNOSIS — Z3A01 Less than 8 weeks gestation of pregnancy: Secondary | ICD-10-CM | POA: Insufficient documentation

## 2016-12-06 DIAGNOSIS — O2341 Unspecified infection of urinary tract in pregnancy, first trimester: Secondary | ICD-10-CM

## 2016-12-06 LAB — URINALYSIS, ROUTINE W REFLEX MICROSCOPIC
BILIRUBIN URINE: NEGATIVE
Bacteria, UA: NONE SEEN
GLUCOSE, UA: NEGATIVE mg/dL
HGB URINE DIPSTICK: NEGATIVE
Ketones, ur: NEGATIVE mg/dL
NITRITE: NEGATIVE
PROTEIN: NEGATIVE mg/dL
Specific Gravity, Urine: 1.004 — ABNORMAL LOW (ref 1.005–1.030)
pH: 6 (ref 5.0–8.0)

## 2016-12-06 LAB — CBC
HEMATOCRIT: 37.9 % (ref 36.0–46.0)
Hemoglobin: 13.3 g/dL (ref 12.0–15.0)
MCH: 31 pg (ref 26.0–34.0)
MCHC: 35.1 g/dL (ref 30.0–36.0)
MCV: 88.3 fL (ref 78.0–100.0)
PLATELETS: 215 10*3/uL (ref 150–400)
RBC: 4.29 MIL/uL (ref 3.87–5.11)
RDW: 13 % (ref 11.5–15.5)
WBC: 5.4 10*3/uL (ref 4.0–10.5)

## 2016-12-06 MED ORDER — SODIUM CHLORIDE 0.9 % IV BOLUS (SEPSIS)
500.0000 mL | Freq: Once | INTRAVENOUS | Status: AC
  Administered 2016-12-06: 500 mL via INTRAVENOUS

## 2016-12-06 MED ORDER — CYCLOBENZAPRINE HCL 10 MG PO TABS
10.0000 mg | ORAL_TABLET | Freq: Once | ORAL | Status: AC
  Administered 2016-12-06: 10 mg via ORAL
  Filled 2016-12-06: qty 1

## 2016-12-06 MED ORDER — METOCLOPRAMIDE HCL 5 MG/ML IJ SOLN
10.0000 mg | Freq: Once | INTRAMUSCULAR | Status: AC
  Administered 2016-12-06: 10 mg via INTRAVENOUS
  Filled 2016-12-06: qty 2

## 2016-12-06 MED ORDER — CEPHALEXIN 500 MG PO CAPS: 500.0000 mg | ORAL_CAPSULE | Freq: Four times a day (QID) | ORAL | 0 refills | Status: DC

## 2016-12-06 MED ORDER — PROMETHAZINE HCL 25 MG PO TABS: 25.0000 mg | ORAL_TABLET | Freq: Four times a day (QID) | ORAL | 0 refills | Status: DC | PRN

## 2016-12-06 MED ORDER — DEXTROSE 5 % IV SOLN
2.0000 g | Freq: Once | INTRAVENOUS | Status: AC
  Administered 2016-12-06: 2 g via INTRAVENOUS
  Filled 2016-12-06: qty 2

## 2016-12-06 NOTE — Discharge Instructions (Signed)
Embarazo e infección urinaria °(Pregnancy and Urinary Tract Infection) °¿QUÉ ES UNA INFECCIÓN URINARIA? °Una infección urinaria (IU) puede ocurrir en cualquier lugar de las vías urinarias. Estas incluyen los riñones, los tubos que conectan los riñones con la vejiga (uréteres), la vejiga y el tubo por el que se elimina la orina del cuerpo (uretra). Estos órganos fabrican, almacenan y eliminan la orina del organismo. La IU puede ser una infección de la vejiga (cistitis) o una infección de los riñones (pielonefritis). Esta infección puede deberse a hongos, virus o bacterias. Las bacterias son las causas más comunes de las IU. °Es más probable presentar una IU durante el embarazo por estas razones: °· Los cambios físicos y hormonales por los que atraviesa el cuerpo pueden hacer que sea más fácil que las bacterias ingresen en las vías urinarias. °· El feto en desarrollo hace presión sobre el útero y puede afectar el flujo de orina. °¿LA IU PONE EN RIESGO AL BEBÉ? °Una IU no tratada durante el embarazo podría ocasionar una infección en los riñones, lo que puede causar problemas de salud que afecten al bebé. Algunas de las complicaciones posibles de una IU no tratada son las siguientes: °· Tener al bebé antes de las 37 semanas de embarazo (prematuro). °· Tener un bebé con bajo peso al nacer. °· Presentar hipertensión arterial durante el embarazo (preeclampsia). °¿CUÁLES SON LOS SÍNTOMAS DE LA IU? °Entre los síntomas de una IU, se incluyen los siguientes: °· Fiebre. °· Micción frecuente o eliminación de pequeñas cantidades de orina con frecuencia. °· Necesidad urgente de orinar. °· Sensación de ardor o dolor al orinar. °· Orina con mal olor u olor atípico. °· Orina turbia. °· Dolor en la parte baja del abdomen o en la espalda. °· Dificultad para orinar. °· Sangre en la orina. °· Vómitos o más apetito de lo normal. °· Diarrea o dolor abdominal. °· Tiene secreción de flujo vaginal. °¿CUÁLES SON LAS OPCIONES DE TRATAMIENTO  PARA LA IU DURANTE EL EMBARAZO? °El tratamiento de esta afección puede incluir lo siguiente: °· Antibióticos cuyo uso es seguro durante el embarazo. °· Otros medicamentos para tratar las causas menos frecuentes de infección urinaria. °¿CÓMO PUEDO PREVENIR UNA IU? °Para prevenir la IU, haga lo siguiente: °· Vaya al baño en cuanto sienta la necesidad de hacerlo. °· Siempre debe limpiarse desde adelante hacia atrás. °· Lávese el área genital con agua tibia y jabón todos los días. °· Vaciar la vejiga antes y después de tener relaciones sexuales. °· Use ropa interior de algodón. °· Limite el consumo de alimentos y bebidas con alto contenido de azúcar, como gaseosas comunes, jugos y dulces. °· Beba de 6 a 8 vasos de agua por día. °· No use pantalones ajustados. °· No se haga duchas vaginales ni use desodorantes en aerosol. °· No tome alcohol, cafeína ni bebidas gaseosas. Estas sustancias pueden irritar la vejiga. °¿CUÁNDO DEBO BUSCAR ATENCIÓN MÉDICA? °Solicite atención médica si: °· Los síntomas no mejoran o empeoran. °· Tiene fiebre después de dos días de tratamiento. °· Tiene una erupción cutánea. °· Tiene flujo vaginal anormal. °· Siente dolor en la espalda o en el costado. °· Tiene escalofríos. °· Tiene náuseas y vómitos. °¿CUÁNDO DEBO BUSCAR ASISTENCIA MÉDICA INMEDIATA? °Solicite atención médica de inmediato si está embarazada y le sucede lo siguiente: °· Siente contracciones en el útero. °· Siente dolor en la parte inferior del abdomen. °· Tiene una pérdida de líquido por la vagina. °· Observa sangre en la orina. °· Tiene vómitos y no puede tragar medicamentos ni agua. °Esta   información no tiene como fin reemplazar el consejo del médico. Asegúrese de hacerle al médico cualquier pregunta que tenga. °Document Released: 11/20/2011 Document Revised: 06/19/2015 Document Reviewed: 01/16/2015 °Elsevier Interactive Patient Education © 2017 Elsevier Inc. ° °

## 2016-12-06 NOTE — MAU Note (Addendum)
Pt was her Saturday for bleeding. Bleeding has stopped. Having pain when she urinates and chills and vomiting. Having lower back pain and neck pain, and headaches.

## 2016-12-06 NOTE — MAU Provider Note (Signed)
History     CSN: [id number]  Arrival date and time: 12/06/16 1019  First Provider Initiated Contact with Patient 12/06/16 1047      Chief Complaint  Patient presents with  . Back Pain  . Dysuria   HPI Kavitha Juley Giovanetti is a 31 y.o. G4P3003 at [redacted]w[redacted]d who presents with dysuria & back pain. Symptoms began Tuesday. Reports increased urinary frequency, urgency, & low back pain. No burning with urination. Denies hematuria. Endorses chills & n/v related to pain. Has not checked temp at home. Denies abdominal pain, vaginal bleeding, or recent back injury. Rates pain in head & back 10/10. Took dose of tylenol this morning without relief.  Spanish interpreter at bedside.   OB History    Gravida Para Term Preterm AB Living   0   3   SAB TAB Ectopic Multiple Live Births         0 3      Past Medical History:  Diagnosis Date  . Medical history non-contributory     Past Surgical History:  Procedure Laterality Date  . NO PAST SURGERIES      Family History  Problem Relation Age of Onset  . Alcohol abuse Neg Hx   . Arthritis Neg Hx   . Asthma Neg Hx   . Birth defects Neg Hx   . Cancer Neg Hx   . COPD Neg Hx   . Depression Neg Hx   . Diabetes Neg Hx   . Drug abuse Neg Hx   . Early death Neg Hx   . Hearing loss Neg Hx   . Heart disease Neg Hx   . Hyperlipidemia Neg Hx   . Hypertension Neg Hx   . Kidney disease Neg Hx   . Learning disabilities Neg Hx   . Mental illness Neg Hx   . Mental retardation Neg Hx   . Miscarriages / Stillbirths Neg Hx   . Stroke Neg Hx   . Vision loss Neg Hx   . Varicose Veins Neg Hx     Social History  Substance Use Topics  . Smoking status: Never Smoker  . Smokeless tobacco: Never Used  . Alcohol use No    Allergies: No Known Allergies  Prescriptions Prior to Admission  Medication Sig Dispense Refill Last Dose  . Prenatal Vit-Fe Fumarate-FA (PRENATAL MULTIVITAMIN) TABS tablet Take 1 tablet by mouth daily at 12 noon.   11/30/2016 at  Unknown time    Review of Systems  Constitutional: Positive for chills. Negative for fever.  Gastrointestinal: Positive for nausea and vomiting. Negative for abdominal pain, constipation and diarrhea.  Genitourinary: Positive for dysuria, frequency and urgency. Negative for flank pain, hematuria, vaginal bleeding and vaginal discharge.  Musculoskeletal: Positive for back pain.   Physical Exam   Blood pressure (!) 98/58, pulse 77, temperature 97.7 F (36.5 C), temperature source Oral, resp. rate 18, weight 179 lb 8 oz (81.4 kg), last menstrual period 09/27/2016, unknown if currently breastfeeding.  Physical Exam  Nursing note and vitals reviewed. Constitutional: She is oriented to person, place, and time. She appears well-developed and well-nourished. No distress.  HENT:  Head: Normocephalic and atraumatic.  Eyes: Conjunctivae are normal. Right eye exhibits no discharge. Left eye exhibits no discharge. No scleral icterus.  Neck: Normal range of motion.  Respiratory: Effort normal. No respiratory distress.  GI: Soft. There is no tenderness. There is no CVA tenderness.  Musculoskeletal:       Lumbar back: Normal.  Neurological:  She is alert and oriented to person, place, and time.  Skin: Skin is warm and dry. She is not diaphoretic.  Psychiatric: She has a normal mood and affect. Her behavior is normal. Judgment and thought content normal.    MAU Course  Procedures Results for orders placed or performed during the hospital encounter of 12/06/16 (from the past 24 hour(s))  Urinalysis, Routine w reflex microscopic     Status: Abnormal   Collection Time: 12/06/16 10:00 AM  Result Value Ref Range   Color, Urine STRAW (A) YELLOW   APPearance CLEAR CLEAR   Specific Gravity, Urine 1.004 (L) 1.005 - 1.030   pH 6.0 5.0 - 8.0   Glucose, UA NEGATIVE NEGATIVE mg/dL   Hgb urine dipstick NEGATIVE NEGATIVE   Bilirubin Urine NEGATIVE NEGATIVE   Ketones, ur NEGATIVE NEGATIVE mg/dL   Protein,  ur NEGATIVE NEGATIVE mg/dL   Nitrite NEGATIVE NEGATIVE   Leukocytes, UA TRACE (A) NEGATIVE   RBC / HPF 0-5 0 - 5 RBC/hpf   WBC, UA 0-5 0 - 5 WBC/hpf   Bacteria, UA NONE SEEN NONE SEEN   Squamous Epithelial / LPF 0-5 (A) NONE SEEN  CBC     Status: None   Collection Time: 12/06/16 11:13 AM  Result Value Ref Range   WBC 5.4 4.0 - 10.5 K/uL   RBC 4.29 3.87 - 5.11 MIL/uL   Hemoglobin 13.3 12.0 - 15.0 g/dL   HCT 16.1 09.6 - 04.5 %   MCV 88.3 78.0 - 100.0 fL   MCH 31.0 26.0 - 34.0 pg   MCHC 35.1 30.0 - 36.0 g/dL   RDW 40.9 81.1 - 91.4 %   Platelets 215 150 - 400 K/uL    MDM IUP on ultrasound as of 9/23 Urine culture on 9/22 showed 20,000 colonies of strep viridans. Will send today's urine for culture & tx for UTI. IV ceftriaxone given in MAU. Pt afebrile & no CVAT. Has f/u at [facility] on 10/5.  Patient reports improvement in symptoms after IV fluids & abx Assessment and Plan  A: 1. Urinary tract infection in mother during first trimester of pregnancy   2. Less than [redacted] weeks gestation of pregnancy    P: Discharge home Rx keflex & phenergan Urine culture pending Keep f/u at [facility] next week Discussed reasons to return to MAU  Judeth Horn 12/06/2016, 10:47 AM

## 2016-12-06 NOTE — MAU Note (Signed)
Pt back to use restroom prior to triage.  Having pain in low back, upper back and neck.  ? Pain with urination.  Will get interpreter

## 2016-12-07 LAB — CULTURE, OB URINE: CULTURE: NO GROWTH

## 2017-01-27 LAB — OB RESULTS CONSOLE GC/CHLAMYDIA
Chlamydia: NEGATIVE
GC PROBE AMP, GENITAL: NEGATIVE

## 2017-01-27 LAB — OB RESULTS CONSOLE VARICELLA ZOSTER ANTIBODY, IGG: Varicella: NON-IMMUNE/NOT IMMUNE

## 2017-01-27 LAB — OB RESULTS CONSOLE HEPATITIS B SURFACE ANTIGEN: Hepatitis B Surface Ag: NEGATIVE

## 2017-01-27 LAB — OB RESULTS CONSOLE RUBELLA ANTIBODY, IGM: Rubella: IMMUNE

## 2017-02-24 ENCOUNTER — Encounter (HOSPITAL_COMMUNITY): Payer: Self-pay | Admitting: *Deleted

## 2017-02-24 ENCOUNTER — Inpatient Hospital Stay (HOSPITAL_COMMUNITY)
Admission: AD | Admit: 2017-02-24 | Discharge: 2017-02-24 | Disposition: A | Discharge status: Home / Self Care | Payer: Medicaid Other | Source: Ambulatory Visit | Attending: Obstetrics and Gynecology | Admitting: Obstetrics and Gynecology

## 2017-02-24 ENCOUNTER — Inpatient Hospital Stay (HOSPITAL_COMMUNITY): Payer: Medicaid Other

## 2017-02-24 DIAGNOSIS — O368320 Maternal care for abnormalities of the fetal heart rate or rhythm, second trimester, not applicable or unspecified: Secondary | ICD-10-CM | POA: Diagnosis present

## 2017-02-24 DIAGNOSIS — Z3A18 18 weeks gestation of pregnancy: Secondary | ICD-10-CM | POA: Insufficient documentation

## 2017-02-24 DIAGNOSIS — Z3492 Encounter for supervision of normal pregnancy, unspecified, second trimester: Secondary | ICD-10-CM

## 2017-02-24 NOTE — MAU Note (Signed)
Pt sent from health dept for no fetal heart beat. Pt denies any vag bleeding or discharge and denies pain.

## 2017-02-24 NOTE — Discharge Instructions (Signed)

## 2017-02-24 NOTE — MAU Provider Note (Signed)
Patient Erin Carney is a [age] y.o. G4P3003 At 8165w6d here [facility] after two providers could not locate a FHR with a Doppler.    History     CSN: [id number]  Arrival date and time: 02/24/17 1654   None     Chief Complaint  Patient presents with  . Multiple Gestational Losses   HPI  Patient is here after FHR was not detected by doppler at her prenatal visit today at [facility].    Patient has had an uncomplicated pregnancy; she denies any problems in this pregnancy or past pregnancy. Denies n, v, discharge, dysuria, abdominal pain or vaginal bleeding.   OB History    Gravida Para Term Preterm AB Living   4 3 3  0   3   SAB TAB Ectopic Multiple Live Births         0 3      Past Medical History:  Diagnosis Date  . Medical history non-contributory     Past Surgical History:  Procedure Laterality Date  . NO PAST SURGERIES      Family History  Problem Relation Age of Onset  . Alcohol abuse Neg Hx   . Arthritis Neg Hx   . Asthma Neg Hx   . Birth defects Neg Hx   . Cancer Neg Hx   . COPD Neg Hx   . Depression Neg Hx   . Diabetes Neg Hx   . Drug abuse Neg Hx   . Early death Neg Hx   . Hearing loss Neg Hx   . Heart disease Neg Hx   . Hyperlipidemia Neg Hx   . Hypertension Neg Hx   . Kidney disease Neg Hx   . Learning disabilities Neg Hx   . Mental illness Neg Hx   . Mental retardation Neg Hx   . Miscarriages / Stillbirths Neg Hx   . Stroke Neg Hx   . Vision loss Neg Hx   . Varicose Veins Neg Hx     Social History   Tobacco Use  . Smoking status: Never Smoker  . Smokeless tobacco: Never Used  Substance Use Topics  . Alcohol use: No  . Drug use: No    Allergies: No Known Allergies  Medications Prior to Admission  Medication Sig Dispense Refill Last Dose  . acetaminophen (TYLENOL) 325 MG tablet Take 650 mg by mouth every 6 (six) hours as needed for mild pain.   12/06/2016 at Unknown time  . cephALEXin (KEFLEX) 500 MG capsule  Take 1 capsule (500 mg total) by mouth 4 (four) times daily. 28 capsule 0   . Prenatal Vit-Fe Fumarate-FA (PRENATAL MULTIVITAMIN) TABS tablet Take 1 tablet by mouth daily at 12 noon.   12/05/2016 at Unknown time  . promethazine (PHENERGAN) 25 MG tablet Take 1 tablet (25 mg total) by mouth every 6 (six) hours as needed for nausea or vomiting. 30 tablet 0     Review of Systems  All other systems reviewed and are negative.  Physical Exam   Blood pressure (!) 96/58, pulse 73, temperature 98 F (36.7 C), resp. rate 18, height 5\' 2"  (1.575 m), weight 177 lb (80.3 kg), last menstrual period 09/27/2016, unknown if currently breastfeeding.  Physical Exam  Constitutional: She is oriented to person, place, and time. She appears well-developed.  HENT:  Head: Normocephalic.  Neck: Normal range of motion.  Respiratory: Effort normal.  GI: Soft.  Musculoskeletal: Normal range of motion.  Neurological: She is alert and  oriented to person, place, and time.  Skin: Skin is warm and dry.  Psychiatric: She has a normal mood and affect.    MAU Course  Procedures  MDM  Bedside US shows cardiac activity with positive fetal movements and FHR of 144.   Assessment and Plan   1. Presence of fetal heart activity in second trimester    2. Patient to keep US appt on 03-10-2017, and to call [facility] to update them on her visit and schedule her next prenatal visit.  3. Warning signs and when to return to MAU reviewed. Patient verbalized understanding.   Charlesetta GaribaldiKathryn Lorraine Kooistra 02/24/2017, 6:15 PM

## 2017-03-11 NOTE — L&D Delivery Note (Signed)
Delivery Note At 10:35 AM a viable female was delivered via Vaginal, Spontaneous (Presentation: ROA).  APGAR: 8, ; weight 2 lb 10 oz (1190 g).   Placenta status: intact, spontaneous with gentle traction.  Cord: 3V with the following complications: none  Anesthesia:  epidural Episiotomy: None Lacerations: None Suture Repair: none needed Est. Blood Loss (mL): 50  Mom to postpartum.  Baby to NICU. Baby delivered in 1 push, baby pink and crying. Delayed cord clamping, then transferred to warmer to NICU team.   Loni MuseKate Timberlake 04/27/2017, 11:12 AM  I was present for delivery and agree with above assessment

## 2017-04-22 ENCOUNTER — Encounter (HOSPITAL_COMMUNITY): Payer: Self-pay | Admitting: *Deleted

## 2017-04-22 ENCOUNTER — Inpatient Hospital Stay (HOSPITAL_COMMUNITY)
Admission: AD | Admit: 2017-04-22 | Discharge: 2017-04-23 | Payer: Medicaid Other | Source: Ambulatory Visit | Attending: Family Medicine | Admitting: Family Medicine

## 2017-04-22 DIAGNOSIS — Z3A27 27 weeks gestation of pregnancy: Secondary | ICD-10-CM | POA: Diagnosis not present

## 2017-04-22 DIAGNOSIS — O42912 Preterm premature rupture of membranes, unspecified as to length of time between rupture and onset of labor, second trimester: Secondary | ICD-10-CM | POA: Diagnosis not present

## 2017-04-22 DIAGNOSIS — O42919 Preterm premature rupture of membranes, unspecified as to length of time between rupture and onset of labor, unspecified trimester: Secondary | ICD-10-CM

## 2017-04-22 LAB — URINALYSIS, ROUTINE W REFLEX MICROSCOPIC
Bilirubin Urine: NEGATIVE
Glucose, UA: NEGATIVE mg/dL
Hgb urine dipstick: NEGATIVE
Ketones, ur: NEGATIVE mg/dL
LEUKOCYTES UA: NEGATIVE
Nitrite: NEGATIVE
PH: 6 (ref 5.0–8.0)
Protein, ur: NEGATIVE mg/dL
Specific Gravity, Urine: 1.006 (ref 1.005–1.030)

## 2017-04-22 MED ORDER — MAGNESIUM SULFATE 40 G IN LACTATED RINGERS - SIMPLE
2.0000 g/h | INTRAVENOUS | Status: DC
  Administered 2017-04-23: 2 g/h via INTRAVENOUS
  Filled 2017-04-22: qty 40

## 2017-04-22 MED ORDER — SODIUM CHLORIDE 0.9 % IV SOLN
2.0000 g | Freq: Four times a day (QID) | INTRAVENOUS | Status: DC
  Administered 2017-04-23: 2 g via INTRAVENOUS
  Filled 2017-04-22 (×2): qty 2000
  Filled 2017-04-22: qty 2

## 2017-04-22 MED ORDER — BETAMETHASONE SOD PHOS & ACET 6 (3-3) MG/ML IJ SUSP
12.0000 mg | INTRAMUSCULAR | Status: DC
  Administered 2017-04-23: 12 mg via INTRAMUSCULAR
  Filled 2017-04-22: qty 2

## 2017-04-22 MED ORDER — BETAMETHASONE SODIUM PHOSPHATE 12 MG/2ML IJ SOLN: Freq: Two times a day (BID) | INTRAMUSCULAR | Status: DC

## 2017-04-22 MED ORDER — AZITHROMYCIN 250 MG PO TABS
1000.0000 mg | ORAL_TABLET | Freq: Once | ORAL | Status: AC
  Administered 2017-04-23: 1000 mg via ORAL
  Filled 2017-04-22: qty 4

## 2017-04-22 MED ORDER — MAGNESIUM SULFATE BOLUS VIA INFUSION
4.0000 g | Freq: Once | INTRAVENOUS | Status: AC
  Administered 2017-04-23: 4 g via INTRAVENOUS
  Filled 2017-04-22: qty 500

## 2017-04-22 NOTE — MAU Provider Note (Signed)
History     CSN: [id number]  Arrival date and time: 04/22/17 2308   None     Chief Complaint  Patient presents with  . Rupture of Membranes   32 yo G4P3003 at 699w0d here because she says her water broke around 10:30 pm. She says she is having occasional contractions about every 10 minutes. [facility] and has had an uncomplicated pregnancy.    Past Medical History:  Diagnosis Date  . Medical history non-contributory     Past Surgical History:  Procedure Laterality Date  . NO PAST SURGERIES      Family History  Problem Relation Age of Onset  . Alcohol abuse Neg Hx   . Arthritis Neg Hx   . Asthma Neg Hx   . Birth defects Neg Hx   . Cancer Neg Hx   . COPD Neg Hx   . Depression Neg Hx   . Diabetes Neg Hx   . Drug abuse Neg Hx   . Early death Neg Hx   . Hearing loss Neg Hx   . Heart disease Neg Hx   . Hyperlipidemia Neg Hx   . Hypertension Neg Hx   . Kidney disease Neg Hx   . Learning disabilities Neg Hx   . Mental illness Neg Hx   . Mental retardation Neg Hx   . Miscarriages / Stillbirths Neg Hx   . Stroke Neg Hx   . Vision loss Neg Hx   . Varicose Veins Neg Hx     Social History   Tobacco Use  . Smoking status: Never Smoker  . Smokeless tobacco: Never Used  Substance Use Topics  . Alcohol use: No  . Drug use: No    Allergies: No Known Allergies  Medications Prior to Admission  Medication Sig Dispense Refill Last Dose  . Prenatal Vit-Fe Fumarate-FA (PRENATAL MULTIVITAMIN) TABS tablet Take 1 tablet by mouth daily at 12 noon.   04/22/2017 at Unknown time  . acetaminophen (TYLENOL) 325 MG tablet Take 650 mg by mouth every 6 (six) hours as needed for mild pain.   Unknown at Unknown time  . cephALEXin (KEFLEX) 500 MG capsule Take 1 capsule (500 mg total) by mouth 4 (four) times daily. 28 capsule 0 Unknown at Unknown time  . promethazine (PHENERGAN) 25 MG tablet Take 1 tablet (25 mg total) by mouth every 6 (six) hours as  needed for nausea or vomiting. 30 tablet 0 Unknown at Unknown time    Review of Systems  Constitutional: Negative for activity change and appetite change.  HENT: Negative for congestion and ear pain.   Eyes: Negative for discharge and itching.  Respiratory: Negative for apnea and chest tightness.   Cardiovascular: Negative for chest pain and leg swelling.  Gastrointestinal: Negative for abdominal distention and abdominal pain.  Endocrine: Negative for cold intolerance and heat intolerance.  Genitourinary: Negative for difficulty urinating, dysuria and vaginal bleeding.  Musculoskeletal: Negative for arthralgias and back pain.  Neurological: Negative for headaches.  Hematological: Negative for adenopathy. Does not bruise/bleed easily.   Physical Exam   Blood pressure 106/65, pulse (!) 106, temperature 99 F (37.2 C), temperature source Oral, last menstrual period 09/27/2016, SpO2 100 %, unknown if currently breastfeeding.  Physical Exam  Constitutional: She is oriented to person, place, and time. She appears well-developed and well-nourished.  HENT:  Head: Normocephalic and atraumatic.  Eyes: Conjunctivae are normal. Pupils are equal, round, and reactive to light.  Neck: Normal range of motion.  Neck supple.  Cardiovascular: Normal rate and intact distal pulses.  Respiratory: No respiratory distress.  GI: Soft. She exhibits no distension.  Genitourinary:  Genitourinary Comments: Speculum exam: light green amniotic fluid visualized from cervical os Cervical exam: 3/50/-2  Musculoskeletal: Normal range of motion. She exhibits no edema.  Neurological: She is alert and oriented to person, place, and time.  Skin: Skin is warm and dry.  Psychiatric: She has a normal mood and affect. Her behavior is normal.    MAU Course  Procedures  MDM Grossly ruptured. Discussed with Dr. Adrian Blackwater and NICU provider.   Assessment and Plan  1. PPROM: start magnesium, give betamethasone, start  latency antibiotics. NICU is full so plan to transfer. [facility] also full, unable to connect with [facility], [facility] declined patient. Will transfer to [facility] in [place].   Rechecked patient 2 hours after initial check and cervix remained unchanged.  [employer] 04/23/2017, 12:44 AM

## 2017-04-22 NOTE — MAU Note (Signed)
Pt presents to MAU c/o SROM @2235 . Pt reports the fluid to be yellowish. Pt reports a few ctxs. Pt reports good FM.

## 2017-04-23 ENCOUNTER — Encounter (HOSPITAL_COMMUNITY): Payer: Self-pay | Admitting: *Deleted

## 2017-04-23 LAB — CBC
HCT: 32.3 % — ABNORMAL LOW (ref 36.0–46.0)
HEMOGLOBIN: 11.5 g/dL — AB (ref 12.0–15.0)
MCH: 32.1 pg (ref 26.0–34.0)
MCHC: 35.6 g/dL (ref 30.0–36.0)
MCV: 90.2 fL (ref 78.0–100.0)
Platelets: 235 10*3/uL (ref 150–400)
RBC: 3.58 MIL/uL — ABNORMAL LOW (ref 3.87–5.11)
RDW: 13 % (ref 11.5–15.5)
WBC: 7.1 10*3/uL (ref 4.0–10.5)

## 2017-04-23 LAB — TYPE AND SCREEN
ABO/RH(D): O POS
Antibody Screen: NEGATIVE

## 2017-04-23 MED ORDER — LACTATED RINGERS IV SOLN
INTRAVENOUS | Status: DC
  Administered 2017-04-23: via INTRAVENOUS

## 2017-04-23 NOTE — MAU Note (Signed)
Report Given To: Charge: Lynnie Transport: Haydee SalterBen Cook Transport: Best boyBill Carelink (incase of them being avaliable first): Anad[employer] Petroleum CorporationBilly Young Charge: Cassandra-transferred me to RN performing direct care and she was given report.   Dr. Rachelle HoraMoss gave report to Dr. Marlowe AschoffKendra Connor

## 2017-04-25 ENCOUNTER — Other Ambulatory Visit: Payer: Self-pay

## 2017-04-25 ENCOUNTER — Inpatient Hospital Stay (HOSPITAL_COMMUNITY)
Admission: AD | Admit: 2017-04-25 | Discharge: 2017-04-29 | DRG: 805 | Disposition: A | Discharge status: Home / Self Care | Payer: Medicaid Other | Source: Ambulatory Visit | Attending: Family Medicine | Admitting: Family Medicine

## 2017-04-25 ENCOUNTER — Encounter (HOSPITAL_COMMUNITY): Payer: Self-pay | Admitting: Obstetrics and Gynecology

## 2017-04-25 DIAGNOSIS — E669 Obesity, unspecified: Secondary | ICD-10-CM | POA: Diagnosis present

## 2017-04-25 DIAGNOSIS — O42912 Preterm premature rupture of membranes, unspecified as to length of time between rupture and onset of labor, second trimester: Principal | ICD-10-CM

## 2017-04-25 DIAGNOSIS — O99214 Obesity complicating childbirth: Secondary | ICD-10-CM | POA: Diagnosis present

## 2017-04-25 DIAGNOSIS — Z3A27 27 weeks gestation of pregnancy: Secondary | ICD-10-CM

## 2017-04-25 DIAGNOSIS — O411230 Chorioamnionitis, third trimester, not applicable or unspecified: Secondary | ICD-10-CM | POA: Diagnosis present

## 2017-04-25 DIAGNOSIS — O42919 Preterm premature rupture of membranes, unspecified as to length of time between rupture and onset of labor, unspecified trimester: Secondary | ICD-10-CM | POA: Diagnosis present

## 2017-04-25 DIAGNOSIS — O4290 Premature rupture of membranes, unspecified as to length of time between rupture and onset of labor, unspecified weeks of gestation: Secondary | ICD-10-CM | POA: Diagnosis present

## 2017-04-25 DIAGNOSIS — O42112 Preterm premature rupture of membranes, onset of labor more than 24 hours following rupture, second trimester: Secondary | ICD-10-CM | POA: Diagnosis not present

## 2017-04-25 DIAGNOSIS — O411220 Chorioamnionitis, second trimester, not applicable or unspecified: Secondary | ICD-10-CM | POA: Diagnosis not present

## 2017-04-25 DIAGNOSIS — O368390 Maternal care for abnormalities of the fetal heart rate or rhythm, unspecified trimester, not applicable or unspecified: Secondary | ICD-10-CM

## 2017-04-25 LAB — CBC
HCT: 32.2 % — ABNORMAL LOW (ref 36.0–46.0)
HEMOGLOBIN: 10.7 g/dL — AB (ref 12.0–15.0)
MCH: 31.2 pg (ref 26.0–34.0)
MCHC: 33.2 g/dL (ref 30.0–36.0)
MCV: 93.9 fL (ref 78.0–100.0)
Platelets: 245 10*3/uL (ref 150–400)
RBC: 3.43 MIL/uL — AB (ref 3.87–5.11)
RDW: 13.4 % (ref 11.5–15.5)
WBC: 9 10*3/uL (ref 4.0–10.5)

## 2017-04-25 LAB — TYPE AND SCREEN
ABO/RH(D): O POS
Antibody Screen: NEGATIVE

## 2017-04-25 MED ORDER — AZITHROMYCIN 250 MG PO TABS
500.0000 mg | ORAL_TABLET | Freq: Every day | ORAL | Status: DC
  Administered 2017-04-26: 500 mg via ORAL
  Filled 2017-04-25: qty 2

## 2017-04-25 MED ORDER — SODIUM CHLORIDE 0.9% FLUSH: 3.0000 mL | INTRAVENOUS | Status: DC | PRN

## 2017-04-25 MED ORDER — ACETAMINOPHEN 325 MG PO TABS: 650.0000 mg | ORAL_TABLET | ORAL | Status: DC | PRN

## 2017-04-25 MED ORDER — CALCIUM CARBONATE ANTACID 500 MG PO CHEW: 2.0000 | CHEWABLE_TABLET | ORAL | Status: DC | PRN

## 2017-04-25 MED ORDER — DOCUSATE SODIUM 100 MG PO CAPS
100.0000 mg | ORAL_CAPSULE | Freq: Every day | ORAL | Status: DC
  Administered 2017-04-26: 100 mg via ORAL
  Filled 2017-04-25: qty 1

## 2017-04-25 MED ORDER — AMOXICILLIN 500 MG PO CAPS
500.0000 mg | ORAL_CAPSULE | Freq: Three times a day (TID) | ORAL | Status: DC
  Administered 2017-04-25 – 2017-04-26 (×4): 500 mg via ORAL
  Filled 2017-04-25 (×4): qty 1

## 2017-04-25 MED ORDER — SODIUM CHLORIDE 0.9 % IV SOLN: 250.0000 mL | INTRAVENOUS | Status: DC | PRN

## 2017-04-25 MED ORDER — PRENATAL MULTIVITAMIN CH
1.0000 | ORAL_TABLET | Freq: Every day | ORAL | Status: DC
  Administered 2017-04-26: 1 via ORAL
  Filled 2017-04-25: qty 1

## 2017-04-25 MED ORDER — SODIUM CHLORIDE 0.9% FLUSH
3.0000 mL | Freq: Two times a day (BID) | INTRAVENOUS | Status: DC
  Administered 2017-04-25: 3 mL via INTRAVENOUS
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
## 2017-04-26 ENCOUNTER — Inpatient Hospital Stay (HOSPITAL_COMMUNITY): Payer: Medicaid Other

## 2017-04-26 ENCOUNTER — Encounter (HOSPITAL_COMMUNITY): Payer: Self-pay | Admitting: *Deleted

## 2017-04-26 MED ORDER — LACTATED RINGERS IV BOLUS (SEPSIS)
1000.0000 mL | Freq: Once | INTRAVENOUS | Status: AC
  Administered 2017-04-26: 1000 mL via INTRAVENOUS

## 2017-04-26 MED ORDER — LACTATED RINGERS IV SOLN
INTRAVENOUS | Status: DC
  Administered 2017-04-26 – 2017-04-27 (×2): via INTRAVENOUS

## 2017-04-26 NOTE — Progress Notes (Signed)
Patient ID: Erin Carney, female   DOB: [birth year], 32 y.o.   MRN: [id number] Called re: single variable deceleration  Had a single isolated contraction lasting 90sec with associated variable decel Since then tracing has been reasuring  Will keep on EFM one more hour and watch

## 2017-04-26 NOTE — Progress Notes (Signed)
FACULTY PRACTICE ANTEPARTUM PROGRESS NOTE  Erin Carney is a 32 y.o. [id number] at 484w4d who is admitted for PROM.  Estimated Date of Delivery: 07/22/17 Fetal presentation is cephalic.  Length of Stay:  1 Days. Admitted 04/25/2017  Subjective: Patient reports normal fetal movement.  She denies uterine contractions, denies bleeding. Reports ongoing leaking from vagina.  Vitals:  Blood pressure (!) 112/53, pulse 84, temperature 98.1 F (36.7 C), temperature source Oral, resp. rate 16, height 5\' 2"  (1.575 m), weight 180 lb (81.6 kg), last menstrual period 09/27/2016, SpO2 97 %, unknown if currently breastfeeding. Physical Examination: CONSTITUTIONAL: Well-developed, well-nourished female in no acute distress.  HENT:  Normocephalic, atraumatic, External right and left ear normal. Oropharynx is clear and moist EYES: Conjunctivae and EOM are normal. Pupils are equal, round, and reactive to light. No scleral icterus.  NECK: Normal range of motion, supple, no masses. SKIN: Skin is warm and dry. No rash noted. Not diaphoretic. No erythema. No pallor. NEUROLGIC: Alert and oriented to person, place, and time. Normal reflexes, muscle tone coordination. No cranial nerve deficit noted. PSYCHIATRIC: Normal mood and affect. Normal behavior. Normal judgment and thought content. CARDIOVASCULAR: Normal heart rate noted, regular rhythm RESPIRATORY: Effort and breath sounds normal, no problems with respiration noted MUSCULOSKELETAL: Normal range of motion. No edema and no tenderness. ABDOMEN: Soft, nontender, nondistended, gravid. CERVIX: deferred  Fetal monitoring: FHR: 140s bpm, Variability: moderate, Accelerations: Present, Decelerations: Absent  Uterine activity: no contractions per hour  Results for orders placed or performed during the hospital encounter of 04/25/17 (from the past 48 hour(s))  CBC     Status: Abnormal   Collection Time: 04/25/17  7:12 PM  Result Value Ref Range   WBC 9.0 4.0 -  10.5 K/uL   RBC 3.43 (L) 3.87 - 5.11 MIL/uL   Hemoglobin 10.7 (L) 12.0 - 15.0 g/dL   HCT 45.432.2 (L) 09.836.0 - 11.946.0 %   MCV 93.9 78.0 - 100.0 fL   MCH 31.2 26.0 - 34.0 pg   MCHC 33.2 30.0 - 36.0 g/dL   RDW 14.713.4 82.911.5 - 56.215.5 %   Platelets 245 150 - 400 K/uL    Comment: Performed at [facility], [address] [place], [place] [address]  Type and screen [facility]     Status: None   Collection Time: 04/25/17  7:12 PM  Result Value Ref Range   ABO/RH(D) O POS    Antibody Screen NEG    Sample Expiration      04/28/2017 Performed at [facility], [address] [place], [place] [address]     No results found.  Current scheduled medications . amoxicillin  500 mg Oral Q8H  . azithromycin  500 mg Oral Daily  . docusate sodium  100 mg Oral Daily  . prenatal multivitamin  1 tablet Oral Q1200  . sodium chloride flush  3 mL Intravenous Q12H    I have reviewed the patient's current medications.  ASSESSMENT: Active Problems:   Premature rupture of membranes   PLAN: PPROM - s/p BTMZ - day 4/7 latency abx - for MFM US growth this am - for NICU consult  Routine care - PNV  Continue routine antenatal care.   Baldemar LenisK. Meryl Eleanor Dimichele, M.D. Attending Obstetrician & Gynecologist, [facility], [facility]

## 2017-04-27 ENCOUNTER — Inpatient Hospital Stay (HOSPITAL_COMMUNITY): Payer: Medicaid Other | Admitting: Anesthesiology

## 2017-04-27 ENCOUNTER — Encounter (HOSPITAL_COMMUNITY): Payer: Self-pay

## 2017-04-27 DIAGNOSIS — Z3A27 27 weeks gestation of pregnancy: Secondary | ICD-10-CM

## 2017-04-27 DIAGNOSIS — O42112 Preterm premature rupture of membranes, onset of labor more than 24 hours following rupture, second trimester: Secondary | ICD-10-CM

## 2017-04-27 LAB — CBC
HCT: 32.6 % — ABNORMAL LOW (ref 36.0–46.0)
Hemoglobin: 11.1 g/dL — ABNORMAL LOW (ref 12.0–15.0)
MCH: 31.2 pg (ref 26.0–34.0)
MCHC: 34 g/dL (ref 30.0–36.0)
MCV: 91.6 fL (ref 78.0–100.0)
Platelets: 241 10*3/uL (ref 150–400)
RBC: 3.56 MIL/uL — ABNORMAL LOW (ref 3.87–5.11)
RDW: 13 % (ref 11.5–15.5)
WBC: 10.8 10*3/uL — AB (ref 4.0–10.5)

## 2017-04-27 LAB — GROUP B STREP BY PCR: Group B strep by PCR: NEGATIVE

## 2017-04-27 LAB — OB RESULTS CONSOLE GBS: GBS: NEGATIVE

## 2017-04-27 MED ORDER — DIBUCAINE 1 % RE OINT: 1.0000 "application " | TOPICAL_OINTMENT | RECTAL | Status: DC | PRN

## 2017-04-27 MED ORDER — SODIUM CHLORIDE 0.9 % IV SOLN
5.0000 10*6.[IU] | Freq: Once | INTRAVENOUS | Status: AC
  Administered 2017-04-27: 5 10*6.[IU] via INTRAVENOUS
  Filled 2017-04-27: qty 5

## 2017-04-27 MED ORDER — LACTATED RINGERS IV SOLN: 500.0000 mL | INTRAVENOUS | Status: DC | PRN

## 2017-04-27 MED ORDER — FENTANYL 2.5 MCG/ML BUPIVACAINE 1/10 % EPIDURAL INFUSION (WH - ANES)
14.0000 mL/h | INTRAMUSCULAR | Status: DC | PRN
  Administered 2017-04-27: 14 mL/h via EPIDURAL
  Filled 2017-04-27: qty 100

## 2017-04-27 MED ORDER — OXYTOCIN 40 UNITS IN LACTATED RINGERS INFUSION - SIMPLE MED: 1.0000 m[IU]/min | INTRAVENOUS | Status: DC

## 2017-04-27 MED ORDER — SOD CITRATE-CITRIC ACID 500-334 MG/5ML PO SOLN: 30.0000 mL | ORAL | Status: DC | PRN

## 2017-04-27 MED ORDER — OXYTOCIN 40 UNITS IN LACTATED RINGERS INFUSION - SIMPLE MED
2.5000 [IU]/h | INTRAVENOUS | Status: DC
  Filled 2017-04-27: qty 1000

## 2017-04-27 MED ORDER — OXYTOCIN BOLUS FROM INFUSION
500.0000 mL | Freq: Once | INTRAVENOUS | Status: AC
  Administered 2017-04-27: 500 mL via INTRAVENOUS

## 2017-04-27 MED ORDER — LACTATED RINGERS IV SOLN
500.0000 mL | Freq: Once | INTRAVENOUS | Status: AC
  Administered 2017-04-27: 500 mL via INTRAVENOUS

## 2017-04-27 MED ORDER — ZOLPIDEM TARTRATE 5 MG PO TABS: 5.0000 mg | ORAL_TABLET | Freq: Every evening | ORAL | Status: DC | PRN

## 2017-04-27 MED ORDER — WITCH HAZEL-GLYCERIN EX PADS: 1.0000 "application " | MEDICATED_PAD | CUTANEOUS | Status: DC | PRN

## 2017-04-27 MED ORDER — ONDANSETRON HCL 4 MG/2ML IJ SOLN: 4.0000 mg | INTRAMUSCULAR | Status: DC | PRN

## 2017-04-27 MED ORDER — MAGNESIUM SULFATE 40 G IN LACTATED RINGERS - SIMPLE
2.0000 g/h | INTRAVENOUS | Status: DC
  Administered 2017-04-27: 2 g/h via INTRAVENOUS
  Filled 2017-04-27: qty 500

## 2017-04-27 MED ORDER — LACTATED RINGERS IV SOLN
INTRAVENOUS | Status: DC
  Administered 2017-04-27: 09:00:00 via INTRAVENOUS

## 2017-04-27 MED ORDER — PHENYLEPHRINE 40 MCG/ML (10ML) SYRINGE FOR IV PUSH (FOR BLOOD PRESSURE SUPPORT)
80.0000 ug | PREFILLED_SYRINGE | INTRAVENOUS | Status: AC | PRN
Start: 2017-04-27 — End: 2017-04-27
  Administered 2017-04-27 (×3): 80 ug via INTRAVENOUS

## 2017-04-27 MED ORDER — ACETAMINOPHEN 325 MG PO TABS: 650.0000 mg | ORAL_TABLET | ORAL | Status: DC | PRN

## 2017-04-27 MED ORDER — DIPHENHYDRAMINE HCL 50 MG/ML IJ SOLN: 12.5000 mg | INTRAMUSCULAR | Status: DC | PRN

## 2017-04-27 MED ORDER — OXYCODONE-ACETAMINOPHEN 5-325 MG PO TABS: 1.0000 | ORAL_TABLET | ORAL | Status: DC | PRN

## 2017-04-27 MED ORDER — EPHEDRINE 5 MG/ML INJ
10.0000 mg | INTRAVENOUS | Status: DC | PRN
  Filled 2017-04-27: qty 2

## 2017-04-27 MED ORDER — MAGNESIUM SULFATE BOLUS VIA INFUSION
6.0000 g | Freq: Once | INTRAVENOUS | Status: AC
  Administered 2017-04-27: 6 g via INTRAVENOUS
  Filled 2017-04-27: qty 500

## 2017-04-27 MED ORDER — ONDANSETRON HCL 4 MG PO TABS: 4.0000 mg | ORAL_TABLET | ORAL | Status: DC | PRN

## 2017-04-27 MED ORDER — TETANUS-DIPHTH-ACELL PERTUSSIS 5-2.5-18.5 LF-MCG/0.5 IM SUSP: 0.5000 mL | Freq: Once | INTRAMUSCULAR | Status: DC

## 2017-04-27 MED ORDER — SENNOSIDES-DOCUSATE SODIUM 8.6-50 MG PO TABS
2.0000 | ORAL_TABLET | ORAL | Status: DC
  Administered 2017-04-27 – 2017-04-28 (×2): 2 via ORAL
  Filled 2017-04-27 (×2): qty 2

## 2017-04-27 MED ORDER — ONDANSETRON HCL 4 MG/2ML IJ SOLN: 4.0000 mg | Freq: Four times a day (QID) | INTRAMUSCULAR | Status: DC | PRN

## 2017-04-27 MED ORDER — FENTANYL CITRATE (PF) 100 MCG/2ML IJ SOLN
100.0000 ug | INTRAMUSCULAR | Status: DC | PRN
  Filled 2017-04-27: qty 2

## 2017-04-27 MED ORDER — DIPHENHYDRAMINE HCL 25 MG PO CAPS: 25.0000 mg | ORAL_CAPSULE | Freq: Four times a day (QID) | ORAL | Status: DC | PRN

## 2017-04-27 MED ORDER — PRENATAL MULTIVITAMIN CH
1.0000 | ORAL_TABLET | Freq: Every day | ORAL | Status: DC
  Administered 2017-04-28: 1 via ORAL
  Filled 2017-04-27: qty 1

## 2017-04-27 MED ORDER — BENZOCAINE-MENTHOL 20-0.5 % EX AERO: 1.0000 "application " | INHALATION_SPRAY | CUTANEOUS | Status: DC | PRN

## 2017-04-27 MED ORDER — PHENYLEPHRINE 40 MCG/ML (10ML) SYRINGE FOR IV PUSH (FOR BLOOD PRESSURE SUPPORT)
80.0000 ug | PREFILLED_SYRINGE | INTRAVENOUS | Status: DC | PRN
  Administered 2017-04-27: 80 ug via INTRAVENOUS
  Filled 2017-04-27: qty 10
  Filled 2017-04-27: qty 5

## 2017-04-27 MED ORDER — PENICILLIN G POT IN DEXTROSE 60000 UNIT/ML IV SOLN
3.0000 10*6.[IU] | INTRAVENOUS | Status: DC
  Filled 2017-04-27 (×5): qty 50

## 2017-04-27 MED ORDER — SIMETHICONE 80 MG PO CHEW: 80.0000 mg | CHEWABLE_TABLET | ORAL | Status: DC | PRN

## 2017-04-27 MED ORDER — OXYCODONE-ACETAMINOPHEN 5-325 MG PO TABS: 2.0000 | ORAL_TABLET | ORAL | Status: DC | PRN

## 2017-04-27 MED ORDER — COCONUT OIL OIL: 1.0000 "application " | TOPICAL_OIL | Status: DC | PRN

## 2017-04-27 MED ORDER — IBUPROFEN 600 MG PO TABS
600.0000 mg | ORAL_TABLET | Freq: Four times a day (QID) | ORAL | Status: DC
  Administered 2017-04-27 – 2017-04-29 (×8): 600 mg via ORAL
  Filled 2017-04-27 (×8): qty 1

## 2017-04-27 MED ORDER — LIDOCAINE HCL (PF) 1 % IJ SOLN
INTRAMUSCULAR | Status: DC | PRN
  Administered 2017-04-27: 13 mL via EPIDURAL

## 2017-04-27 MED ORDER — TERBUTALINE SULFATE 1 MG/ML IJ SOLN: 0.2500 mg | Freq: Once | INTRAMUSCULAR | Status: DC | PRN

## 2017-04-27 MED ORDER — LIDOCAINE HCL (PF) 1 % IJ SOLN
30.0000 mL | INTRAMUSCULAR | Status: DC | PRN
  Filled 2017-04-27: qty 30

## 2017-04-27 MED ORDER — OXYTOCIN 40 UNITS IN LACTATED RINGERS INFUSION - SIMPLE MED
1.0000 m[IU]/min | INTRAVENOUS | Status: DC
  Administered 2017-04-27: 2 m[IU]/min via INTRAVENOUS

## 2017-04-27 NOTE — Progress Notes (Signed)
Video interpretor used [employer]istar International CorporationStephanie [phone]

## 2017-04-27 NOTE — Progress Notes (Signed)
[employer] Video Interpreter being used at bedside for translation.

## 2017-04-27 NOTE — Lactation Note (Addendum)
This note was copied from a baby's chart. Lactation Consultation Note  Patient Name: Erin Carney: 04/27/2017   Spanish interpreter present. Baby 2658w5d < 3 lbs. P4, Ex BF 6 mos - 1 year.  Baby 4 hours old. Reviewed hand expression with good flow.  Set up DEBP in room with labels and NICU booklet. Recommend mother post pump q 2.5-3 hr per day for 15-20 min with DEBP on initiation setting. Reviewed cleaning and milk storage.  Changed flanges to #27.  Demonstrated how to use hands on pumping. Mother pumped approx 2 ml which FOB will take to the NICU. Lactation brochure given to family.    Maternal Data    Feeding    LATCH Score                   Interventions    Lactation Tools Discussed/Used     Consult Status      Hardie PulleyBerkelhammer, Ruth Boschen 04/27/2017, 2:56 PM

## 2017-04-27 NOTE — Progress Notes (Signed)
Erin Carney is a 32 y.o. [id number] at 8242w5d admitted for PPROM since 2/12.   Subjective: Comfortable with epidural.   Objective: BP 98/60   Pulse 79   Temp 98.7 F (37.1 C) (Oral)   Resp 16   Ht 5\' 2"  (1.575 m)   Wt 81.6 kg (180 lb)   LMP 09/27/2016   SpO2 96%   BMI 32.92 kg/m  I/O last 3 completed shifts: In: -  Out: 150 [Urine:150] No intake/output data recorded.  FHT:  FHR: 145 bpm, variability: minimal ,  accelerations:  Present,  decelerations:  Present variables UC:   regular, every 5 minutes SVE:   Dilation: 10 Effacement (%): 80 Exam by:: Wynelle BourgeoisWilliams Marie CNM   Labs: Lab Results  Component Value Date   WBC 10.8 (H) 04/27/2017   HGB 11.1 (L) 04/27/2017   HCT 32.6 (L) 04/27/2017   MCV 91.6 04/27/2017   PLT 241 04/27/2017    Assessment / Plan: Augmentation of labor, progressing well  Labor: Progressing normally preterm:  on magnesium sulfate Fetal Wellbeing:  Category II Pain Control:  Epidural I/D:  On penicillin Anticipated MOD:  NSVD   Rotate on peanut, increase PIT, anticipate SVD soon.  Loni MuseKate Jacquelene Kopecky 04/27/2017, 9:13 AM

## 2017-04-27 NOTE — Progress Notes (Signed)
Patient ID: Erin Carney, female   DOB: Jan 20, 1986, 32 y.o.   MRN: [id number] Called to evaluate patient complaining of pain. Patient very uncomfortable with contractions breathing through them. She reports good fetal movement, continued leakage of fluid and denies vaginal bleeding. She reports contractions every 3-5 minutes  Blood pressure (!) 87/46, pulse 92, temperature 98.7 F (37.1 C), temperature source Oral, resp. rate 15, height 5\' 2"  (1.575 m), weight 180 lb (81.6 kg), last menstrual period 09/27/2016, SpO2 99 %, unknown if currently breastfeeding.  GENERAL: Well-developed, well-nourished female in no acute distress.  ABDOMEN: Soft, nontender, gravid. Palpable contractions CERVIX: SSE: positive pooling, visible cephalic presentation tightly applied against the cervix. Sterile digital exam 4.5/80/-3 EXTREMITIES: No cyanosis, clubbing, or edema, 2+ distal pulses.  FHT: 140, mod variability, no accels, no decels Toco: contractions q 2-3 minutes  A/P 32 yo G5P3013 at 940w5d with PPROM with preterm labor - Discussed the need to start induction of labor given signs of chorioamnionitis - Patient completed a course of BMZ on 2/14 - will obtain GBS PCR - Transfer to birthing suite - Spanish interpreter used

## 2017-04-27 NOTE — Anesthesia Procedure Notes (Signed)
Epidural Patient location during procedure: OB Start time: 04/27/2017 8:34 AM End time: 04/27/2017 8:57 AM  Staffing Anesthesiologist: Lowella CurbMiller, Khalee Mazo Ray, MD Performed: anesthesiologist   Preanesthetic Checklist Completed: patient identified, site marked, surgical consent, pre-op evaluation, timeout performed, IV checked, risks and benefits discussed and monitors and equipment checked  Epidural Patient position: sitting Prep: ChloraPrep Patient monitoring: heart rate, cardiac monitor, continuous pulse ox and blood pressure Approach: midline Location: L2-L3 Injection technique: LOR saline  Needle:  Needle type: Tuohy  Needle gauge: 17 G Needle length: 9 cm Needle insertion depth: 6 cm Catheter type: closed end flexible Catheter size: 20 Guage Catheter at skin depth: 10 cm Test dose: negative  Assessment Events: blood not aspirated, injection not painful, no injection resistance, negative IV test and no paresthesia  Additional Notes Reason for block:procedure for pain

## 2017-04-27 NOTE — Anesthesia Preprocedure Evaluation (Signed)

## 2017-04-28 ENCOUNTER — Encounter (HOSPITAL_COMMUNITY): Payer: Self-pay

## 2017-04-28 LAB — CBC
HCT: 30.9 % — ABNORMAL LOW (ref 36.0–46.0)
HEMOGLOBIN: 10.6 g/dL — AB (ref 12.0–15.0)
MCH: 31.3 pg (ref 26.0–34.0)
MCHC: 34.3 g/dL (ref 30.0–36.0)
MCV: 91.2 fL (ref 78.0–100.0)
Platelets: 223 10*3/uL (ref 150–400)
RBC: 3.39 MIL/uL — AB (ref 3.87–5.11)
RDW: 13.1 % (ref 11.5–15.5)
WBC: 8.4 10*3/uL (ref 4.0–10.5)

## 2017-04-28 NOTE — Lactation Note (Signed)
This note was copied from a baby's chart. Lactation Consultation Note  Patient Name: Erin Waynetta SandyDeysi Moran Guillen RUEAV'WToday's Date: 04/28/2017 Reason for consult: Follow-up assessment   [place] interpreter used for Spanish. Baby 27 hours old in NICU. 1169w5d < 3 lbs.  Ex BF. Mother has been pumping q 3 hours and bringing colostrum to the NICU. Suggest she can pump q 2.5- 3 hours and provided her with more colostrum containers. Mother states her breasts are tender when she hand expresses so she prefers pumping. [facility] mother for pump loaner.   Maternal Data    Feeding    LATCH Score                   Interventions    Lactation Tools Discussed/Used     Consult Status Consult Status: PRN    Hardie PulleyBerkelhammer, Aniesa Boback Boschen 04/28/2017, 1:55 PM

## 2017-04-28 NOTE — Anesthesia Postprocedure Evaluation (Signed)
Anesthesia Post Note  Patient: Erin Carney  Procedure(s) Performed: AN AD HOC LABOR EPIDURAL     Patient location during evaluation: Mother Baby Anesthesia Type: Epidural Level of consciousness: awake and alert and oriented Pain management: satisfactory to patient Vital Signs Assessment: post-procedure vital signs reviewed and stable Respiratory status: spontaneous breathing and nonlabored ventilation Cardiovascular status: stable Postop Assessment: no headache, no backache, no signs of nausea or vomiting, adequate PO intake and patient able to bend at knees (patient up walking) Anesthetic complications: no    Last Vitals:  Vitals:   04/27/17 1925 04/27/17 2247  BP: (!) 99/55 (!) 90/46  Pulse: 80 83  Resp: 16 16  Temp: 36.6 C 36.8 C  SpO2: 98% 98%    Last Pain:  Vitals:   04/28/17 0234  TempSrc:   PainSc: Asleep   Pain Goal: Patients Stated Pain Goal: 2 (04/27/17 2304)               Madison HickmanGREGORY,Suhail Peloquin

## 2017-04-28 NOTE — Progress Notes (Signed)
Post Partum Day 1 s/p SVD at 4049w6d after IOL due to suspected chorioamnionitis  Subjective: No complaints, up ad lib, voiding, tolerating PO and + flatus.  Breastpumping. Baby is doing well in NICU. Patient is Spanish-speaking only, Spanish interpreter present for this encounter.  Objective: Blood pressure (!) 90/46, pulse 83, temperature 98.2 F (36.8 C), temperature source Oral, resp. rate 16, height 5\' 2"  (1.575 m), weight 180 lb (81.6 kg), last menstrual period 09/27/2016, SpO2 98 %, unknown if currently breastfeeding.  Physical Exam:  General: alert and no distress Lochia: appropriate Uterine Fundus: firm DVT Evaluation: No evidence of DVT seen on physical exam. Negative Homan's sign.  Recent Labs    04/27/17 0749 04/28/17 0521  HGB 11.1* 10.6*  HCT 32.6* 30.9*    Assessment/Plan: Plan for discharge tomorrow, Breastfeeding and Contraception POPs Routine postpartum care.   LOS: 3 days   Jaynie CollinsUgonna Anyanwu, MD 04/28/2017, 7:45 AM

## 2017-04-29 DIAGNOSIS — O411220 Chorioamnionitis, second trimester, not applicable or unspecified: Secondary | ICD-10-CM | POA: Diagnosis not present

## 2017-04-29 LAB — RPR: RPR Ser Ql: NONREACTIVE

## 2017-04-29 MED ORDER — NORETHINDRONE 0.35 MG PO TABS: 1.0000 | ORAL_TABLET | Freq: Every day | ORAL | 11 refills | Status: DC

## 2017-04-29 MED ORDER — IBUPROFEN 600 MG PO TABS: 600.0000 mg | ORAL_TABLET | Freq: Four times a day (QID) | ORAL | 2 refills | Status: DC | PRN

## 2017-04-29 NOTE — Progress Notes (Signed)
Patient discharged home with husband. Discharge teaching, home care, prescriptions, and follow-up discussed. Pt verbalized understanding and had no further questions. In house spanish interpreter used for entire teaching.

## 2017-04-29 NOTE — Discharge Summary (Signed)
OB Discharge Summary     Patient Name: Erin Carney DOB: [birth year] MRN: [id number]  Date of admission: 04/25/2017 Delivering MD: Wyvonnia Dusky D   Date of discharge: 04/29/2017  Admitting diagnosis: 27.3wks ruptured membrane Intrauterine pregnancy: [redacted]w[redacted]d     Secondary diagnosis:  Principal Problem:   Preterm premature rupture of membranes (PPROM) delivered, current hospitalization Active Problems:   Second-degree perineal laceration, with delivery   Premature rupture of membranes   Preterm labor with preterm delivery second trimester   Chorioamnionitis in second trimester   Additional problems: None     Discharge diagnosis: Preterm Pregnancy Delivered                                                                                                Post partum procedures:None  Augmentation: Pitocin  Complications: None  Brief Hospital Course: 32 y.o. is a [id number] who was admitted 04/25/2017 with preterm premature rupture of membranes (PPROM) at [redacted]w[redacted]d.  She was admitted to antenatal unit and started on betamethasone (2 doses, 24 hours apart), latency antibiotics, and magnesium sulfate for tocolysis and fetal neuroprotection.  She had no signs or symptoms of toxicity, and the magnesium sulfate was discontinued after 24 hours. Patient also had neonatology consultation.  She had no signs or symptoms of progressing preterm labor,  daily NSTs which remained reassuring, and initially had no other maternal-fetal concerns.  However, on HD#3, she started to have symptoms concerning for chorioamnionitis.  She was informed of need for induction of labor.   She was transferred to L&D where she had an uncomplicated SVD of female infant after induction of labor; please refer to delivery note for more details.  Infant was taken to the NICU, and the patient was transferred to the postpartum unit.    Patient had an uncomplicated postpartum course.  By time of discharge on PPD#2, her pain was  controlled on oral pain medications; she had appropriate lochia and was ambulating, voiding without difficulty, tolerating regular diet and passing flatus.  She is breastfeeding and will use Micronor for postpartum contraception.  She was deemed stable for discharge to home.    Discharge Exam: Vitals:   04/28/17 0800 04/28/17 1653 04/28/17 2045 04/29/17 0800  BP: (!) 93/51 (!) 101/57 96/63 101/60  Pulse: 76 64 76 77  Resp: 16 18 18 18   Temp: 97.8 F (36.6 C) 98.7 F (37.1 C) 97.8 F (36.6 C) 98.4 F (36.9 C)  TempSrc: Oral Oral  Oral  SpO2: 99% 98% 100% 100%  Weight:      Height:      General appearance: Alert and no distress Resp: Clear to auscultation bilaterally Cardio: Regular rate and rhythm Abdomen: Fundus below umbilicus, appropriate lochia. NT abdomen. Extremities: Extremities normal, atraumatic, no cyanosis or edema and Homans sign is negative, no sign of DVT Pulses: 2+ and symmetric Neurologic: Alert and oriented X 3, normal strength and tone. Normal symmetric reflexes. Normal coordination and gait   Labs: Lab Results  Component Value Date   WBC 8.4 04/28/2017   HGB 10.6 (L) 04/28/2017   HCT 30.9 (L) 04/28/2017  MCV 91.2 04/28/2017   PLT 223 04/28/2017   No flowsheet data found.  Discharge instruction: per After Visit Summary and "Baby and Me Booklet".  After visit meds:  Allergies as of 04/29/2017   No Known Allergies     Medication List    TAKE these medications   cephALEXin 500 MG capsule Commonly known as:  KEFLEX Take 1 capsule (500 mg total) by mouth 4 (four) times daily.   ibuprofen 600 MG tablet Commonly known as:  ADVIL,MOTRIN Take 1 tablet (600 mg total) by mouth every 6 (six) hours as needed for mild pain, moderate pain or cramping.   norethindrone 0.35 MG tablet Commonly known as:  MICRONOR,CAMILA,ERRIN Take 1 tablet (0.35 mg total) by mouth daily.   prenatal multivitamin Tabs tablet Take 1 tablet by mouth daily at 12 noon.    promethazine 25 MG tablet Commonly known as:  PHENERGAN Take 1 tablet (25 mg total) by mouth every 6 (six) hours as needed for nausea or vomiting.       Diet: routine diet  Activity: Advance as tolerated. Pelvic rest for 6 weeks.   [facility]  Postpartum contraception: Progesterone only pills  Newborn Data: Live born female  Birth Weight: 2 lb 10 oz (1190 g) APGAR: 8, 8  Newborn Delivery   Birth date/time:  04/27/2017 10:35:00 Delivery type:  Vaginal, Spontaneous     Baby Feeding: Breast Disposition:NICU   04/29/2017 Jaynie CollinsUgonna Jacqlyn Marolf, MD

## 2017-04-29 NOTE — Discharge Instructions (Signed)
Parto normal, cuidados aps o procedimento Vaginal Delivery, Care After Consulte este folheto nas prximas semanas. Essas instrues fornecem informaes sobre como cuidar de si mesma aps um parto normal. Seu mdico tambm poder fornecer instrues mais especficas. Seu tratamento foi planejado de acordo com as prticas mdicas atuais, mas s vezes problemas podem ocorrer. Ligue para o seu mdico caso tenha qualquer Computer Sciences Corporationum dos problemas ou dvidas a seguir. O que posso esperar aps o procedimento? Aps um parto normal,  comum ocorrer:  Algum sangramento pela vagina.  Dor no abdome, na vagina e na pele entre a Print production plannerabertura da vagina e o nus (perneo).  Clicas plvicas.  Fadiga.  Siga essas instrues em casa: Medicamentos  Tome medicamentos vendidos com ou sem receita mdica somente de acordo com as indicaes do seu mdico.  Caso tenha recebido uma prescrio de antibitico, tome-o somente como determinado pelo seu mdico. No pare de tomar o antibitico at General Motorsele ter terminado. Direo   No dirija nem opere mquinas pesadas enquanto estiver tomando analgsicos vendidos com receita mdica.  No dirija por 24 horas caso tenha recebido um sedativo. Estilo de vida  No beba lcool. Isso ser especialmente importante caso voc esteja amamentando ou tomando medicamentos para Paramedicaliviar a dor.  No use derivados do tabaco, incluindo cigarros, tabaco de mascar ou cigarros eletrnicos. Caso precise de ajuda para parar de fumar, fale com seu mdico. Alimentos e bebidas  Beba pelo menos oito copos de 8 onas de gua todos os dias a menos que seu mdico diga para voc no fazer isso. Caso opte por amamentar seu beb, voc poder precisar beber Brink's Companymais gua ainda.  Consuma alimentos com elevado teor de fibras todos os dias. Esses alimentos podero ajudar a Agricultural consultantprevenir ou aliviar a constipao. Alimentos ricos em fibras incluem: ? Cereais e pes integrais. ? Arroz integral. ? Feijes. ? Frutas e legumes  frescos. Atividades  Retorne s suas atividades normais de acordo com as orientaes do seu mdico. Pergunte ao seu mdico quais atividades so seguras para voc.  Repouse o mximo possvel. Tente descansar ou tirar uma soneca quando seu beb estiver dormindo.  No levante nada que seja mais pesado que o seu beb ou pese mais de 10 libras (4,5 kg) at BellSouthseu mdico dizer que  seguro faz-lo.  Converse com seu mdico sobre quando voc poder ter relaes sexuais. Isso poder depender: ? Do risco de infeces. ? Da velocidade Biomedical engineerda cicatrizao. ? Do conforto e do desejo de realizar atividade sexual. K. I. Sawyeromo cuidar da vagina  Caso tenha realizado episiotomia ou sofrido lacerao vaginal, verifique a rea todos os dias em busca de sinais de infeco. Verifique a ocorrncia de: ? Aumento da vermelhido, do Film/video editorinchao ou da dor. ? Aumento da quantidade de lquido ou sangue. ? Calor. ? Pus ou mau cheiro.  No use absorventes internos nem faa duchas vaginais at o seu mdico dizer que  seguro faz-lo.  Fique atenta para cogulos que possam ser expelidos pela sua vagina. Esses cogulos podem se parecer com acmulos de secreo de cor vermelha escura, marrom ou preta. Instrues gerais  Mantenha seu perneo limpo e seco de acordo com as orientaes do seu mdico.  Use roupas confortveis e largas.  Limpe-se com movimentos da frente para trs aps usar o vaso sanitrio.  Pergunte ao seu mdico se voc pode tomar banho. Caso tenha realizado episiotomia ou sofrido lacerao perineal durante o trabalho de parto ou durante o parto, seu mdico poder lhe dizer para no tomar banhos por um determinado perodo.  Use um suti que oferece apoio aos seus seios e seja do tamanho adequado.  Se possvel, pea a algum para ajudar voc com as tarefas domsticas e ajude a tomar conta do beb por pelo menos alguns dias depois que voc deixar o hospital.  Comparea a todas as consultas de acompanhamento suas e do  beb de acordo com as orientaes do seu mdico. Isso  importante. Entre em contato com um mdico se:  Voc apresentar: ? Secreo vaginal com cheiro ruim. ? Dificuldade para urinar. ? Dor ao urinar. ? Aumento ou reduo sbita da Air Products and Chemicalsfrequncia das suas defecaes. ? Aumento da vermelhido, do inchao ou da dor na episiotomia ou lacerao vaginal. ? Mais lquido ou sangue saindo da episiotomia ou lacerao vaginal. ? Pus ou cheiro ruim oriundo da episiotomia ou lacerao vaginal. ? Danna HeftyFebre. ? Uma erupo cutnea. ? Pouco ou nenhum interesse nas atividades de que voc Orangeburggostava. ? Dvidas sobre como cuidar de voc mesma ou do beb.  Sua episiotomia ou lacerao vaginal parecer quente ao toque.  Sua episiotomia ou lacerao vaginal se abrir e no Visual merchandiserparecer estar cicatrizando.  Suas mamas ficarem doloridas, duras ou avermelhadas.  Voc se sentir incomumente triste ou preocupada.  Sentir enjoo ou vomitar.  Expelir grandes cogulos de sangue pela vagina. Caso isso acontea, guarde os cogulos de sua vagina para mostr-los ao seu mdico. No elimine cogulos no vaso sanitrio sem que seu mdico os examine.  Urinar mais do que o normal.  Sentir tontura ou vertigem.  No tiver amamentado nenhuma vez e no menstruar por 12 semanas aps o parto.  Tiver parado de amamentar e no menstruar por 12 semanas aps parar de amamentar. Tomasa Hosebtenha ajuda imediatamente se:  Voc apresentar: ? Dor que no desaparece ou no melhora com o medicamento. ? Dor no peito. ? Dificuldade para respirar. ? Viso embaada ou manchas na viso. ? Pensar em fazer mal a si mesma ou ao seu beb.  Surgirem dores no abdome ou em Parker Hannifinuma das pernas.  Sentir dor de cabea intensa.  Desmaiar.  Sangrar muito pela vagina a ponto de usar dois absorventes em uma hora. Estas informaes no se destinam a substituir as recomendaes de seu mdico. No deixe de discutir quaisquer dvidas com seu mdico. Document Released:  02/25/2005 Document Revised: 06/19/2016 Document Reviewed: 03/12/2015 Elsevier Interactive Patient Education  2018 [employer]

## 2017-10-14 ENCOUNTER — Encounter (HOSPITAL_COMMUNITY): Payer: Self-pay

## 2020-01-16 ENCOUNTER — Emergency Department (HOSPITAL_COMMUNITY)
Admission: EM | Admit: 2020-01-16 | Discharge: 2020-01-16 | Disposition: A | Discharge status: Home / Self Care | Payer: Medicaid Other | Attending: Emergency Medicine | Admitting: Emergency Medicine

## 2020-01-16 ENCOUNTER — Encounter (HOSPITAL_COMMUNITY): Payer: Self-pay | Admitting: Emergency Medicine

## 2020-01-16 ENCOUNTER — Other Ambulatory Visit: Payer: Self-pay

## 2020-01-16 DIAGNOSIS — G8929 Other chronic pain: Secondary | ICD-10-CM | POA: Insufficient documentation

## 2020-01-16 DIAGNOSIS — M545 Low back pain, unspecified: Secondary | ICD-10-CM | POA: Diagnosis not present

## 2020-01-16 LAB — URINALYSIS, ROUTINE W REFLEX MICROSCOPIC
Bilirubin Urine: NEGATIVE
Glucose, UA: NEGATIVE mg/dL
Hgb urine dipstick: NEGATIVE
Ketones, ur: NEGATIVE mg/dL
Leukocytes,Ua: NEGATIVE
Nitrite: NEGATIVE
Protein, ur: NEGATIVE mg/dL
Specific Gravity, Urine: 1.018 (ref 1.005–1.030)
pH: 6 (ref 5.0–8.0)

## 2020-01-16 LAB — PREGNANCY, URINE: Preg Test, Ur: NEGATIVE

## 2020-01-16 MED ORDER — CYCLOBENZAPRINE HCL 10 MG PO TABS
10.0000 mg | ORAL_TABLET | Freq: Once | ORAL | Status: AC
  Administered 2020-01-16: 10 mg via ORAL
  Filled 2020-01-16: qty 1

## 2020-01-16 MED ORDER — CYCLOBENZAPRINE HCL 10 MG PO TABS: 10.0000 mg | ORAL_TABLET | Freq: Three times a day (TID) | ORAL | 0 refills | Status: AC | PRN

## 2020-01-16 MED ORDER — PREDNISONE 10 MG PO TABS: 40.0000 mg | ORAL_TABLET | Freq: Every day | ORAL | 0 refills | Status: AC

## 2020-01-16 NOTE — ED Triage Notes (Signed)
Pt coming from home. Pt complaint of back pain for 3-4 days. Pt states she has been taking tylenol for pain but today it is not helping. VSS. NAD.

## 2020-01-16 NOTE — ED Provider Notes (Signed)
[facility] EMERGENCY DEPARTMENT Provider Note   CSN: [id number] Arrival date & time: 01/16/20  1624     History Chief Complaint  Patient presents with  . Back Pain    Erin Carney is a 34 y.o. female.  The history is provided by the patient. The history is limited by a language barrier. A language interpreter was used.  Back Pain Location:  Lumbar spine Quality:  Burning Radiates to:  Does not radiate Pain severity:  Moderate Pain is:  Same all the time Onset quality:  Gradual Duration:  4 days Timing:  Constant Progression:  Waxing and waning Chronicity:  Recurrent (has occurred intermittently for months, last for a few days at a time) Relieved by:  Nothing Worsened by:  Movement Ineffective treatments: tylenol. Associated symptoms: no abdominal pain, no bladder incontinence, no bowel incontinence, no chest pain, no dysuria, no fever, no numbness, no paresthesias, no perianal numbness and no weakness        Past Medical History:  Diagnosis Date  . Medical history non-contributory     Patient Active Problem List   Diagnosis Date Noted  . Preterm labor with preterm delivery second trimester 04/29/2017  . Chorioamnionitis in second trimester 04/29/2017  . Premature rupture of membranes 04/25/2017  . Preterm premature rupture of membranes (PPROM) delivered, current hospitalization 02/24/2015  . Second-degree perineal laceration, with delivery 02/22/2015    Past Surgical History:  Procedure Laterality Date  . NO PAST SURGERIES       OB History    Gravida  5   Para  3   Term  3   Preterm  0   AB  1   Living  3     SAB  1   TAB      Ectopic      Multiple  0   Live Births  3           Family History  Problem Relation Age of Onset  . Alcohol abuse Neg Hx   . Arthritis Neg Hx   . Asthma Neg Hx   . Birth defects Neg Hx   . Cancer Neg Hx   . COPD Neg Hx   . Depression Neg Hx   . Diabetes Neg Hx   . Drug abuse  Neg Hx   . Early death Neg Hx   . Hearing loss Neg Hx   . Heart disease Neg Hx   . Hyperlipidemia Neg Hx   . Hypertension Neg Hx   . Kidney disease Neg Hx   . Learning disabilities Neg Hx   . Mental illness Neg Hx   . Mental retardation Neg Hx   . Miscarriages / Stillbirths Neg Hx   . Stroke Neg Hx   . Vision loss Neg Hx   . Varicose Veins Neg Hx     Social History   Tobacco Use  . Smoking status: Never Smoker  . Smokeless tobacco: Never Used  Substance Use Topics  . Alcohol use: No  . Drug use: No    Home Medications Prior to Admission medications   Medication Sig Start Date End Date Taking? Authorizing Provider  cephALEXin (KEFLEX) 500 MG capsule Take 1 capsule (500 mg total) by mouth 4 (four) times daily. Patient not taking: Reported on 04/26/2017 12/06/16   Judeth Horn, NP  cyclobenzaprine (FLEXERIL) 10 MG tablet Take 1 tablet (10 mg total) by mouth 3 (three) times daily as needed for up to 3 days for muscle  spasms. 01/16/20 01/19/20  Loletha Carrow, MD  ibuprofen (ADVIL,MOTRIN) 600 MG tablet Take 1 tablet (600 mg total) by mouth every 6 (six) hours as needed for mild pain, moderate pain or cramping. 04/29/17   Anyanwu, Jethro Bastos, MD  norethindrone (MICRONOR,CAMILA,ERRIN) 0.35 MG tablet Take 1 tablet (0.35 mg total) by mouth daily. 04/29/17   Anyanwu, Jethro Bastos, MD  predniSONE (DELTASONE) 10 MG tablet Take 4 tablets (40 mg total) by mouth daily for 5 days. 01/16/20 01/21/20  Loletha Carrow, MD  Prenatal Vit-Fe Fumarate-FA (PRENATAL MULTIVITAMIN) TABS tablet Take 1 tablet by mouth daily at 12 noon.    [provider]  promethazine (PHENERGAN) 25 MG tablet Take 1 tablet (25 mg total) by mouth every 6 (six) hours as needed for nausea or vomiting. Patient not taking: Reported on 04/26/2017 12/06/16   Judeth Horn, NP    Allergies    Patient has no known allergies.  Review of Systems   Review of Systems  Constitutional: Negative for chills and fever.  HENT:  Negative for ear pain and sore throat.   Eyes: Negative for pain and visual disturbance.  Respiratory: Negative for cough and shortness of breath.   Cardiovascular: Negative for chest pain and palpitations.  Gastrointestinal: Negative for abdominal pain, bowel incontinence, constipation, diarrhea and vomiting.  Genitourinary: Positive for frequency. Negative for bladder incontinence, difficulty urinating, dysuria, hematuria, vaginal bleeding, vaginal discharge and vaginal pain.  Musculoskeletal: Positive for back pain. Negative for arthralgias.  Skin: Negative for color change and rash.  Neurological: Negative for seizures, syncope, weakness, numbness and paresthesias.  All other systems reviewed and are negative.   Physical Exam Updated Vital Signs BP 101/71 (BP Location: Left Arm)   Pulse 71   Temp 98.3 F (36.8 C) (Oral)   Resp 16   Ht 5\' 3"  (1.6 m)   Wt 80.7 kg   SpO2 98%   BMI 31.53 kg/m   Physical Exam Vitals and nursing note reviewed.  Constitutional:      Appearance: She is well-developed. She is obese. She is not ill-appearing, toxic-appearing or diaphoretic.  HENT:     Head: Normocephalic and atraumatic.  Eyes:     Conjunctiva/sclera: Conjunctivae normal.  Cardiovascular:     Rate and Rhythm: Normal rate and regular rhythm.     Heart sounds: No murmur heard.  No gallop.   Pulmonary:     Effort: Pulmonary effort is normal. No respiratory distress.     Breath sounds: Normal breath sounds.  Abdominal:     General: There is no distension.     Palpations: Abdomen is soft.     Tenderness: There is no abdominal tenderness.  Musculoskeletal:     Cervical back: Neck supple. No deformity or tenderness.     Thoracic back: No deformity or tenderness. Normal range of motion.     Lumbar back: No deformity or tenderness. Normal range of motion.  Skin:    General: Skin is warm and dry.  Neurological:     Mental Status: She is alert.     Comments: Mental status: alert and  oriented to person, place, time, situation. Speech: Speech is clear and language is not aphasic Fund of knowledge: Intact  Cranial Nerves:  II: Intact to confrontation bilaterally III, IV, VI: EOMI, no nystagmus V: face sensation intact, good masseter strength VII: no facial droop or weakness VIII: gross hearing intact bilaterally IX/XI: palate elevates symmetrically XII: tongue protrudes symmetrically, no deviation  Strength: 5/5 and symmetric in BUE and  BLE. No pronation or drift. Tone: normal tone, no tremors Coordination: Intact finger to nose and heel to shin. Sensation: intact to light touch in all extremities.  Romberg negative.  Gait: Routine gait stable without assistance      ED Results / Procedures / Treatments   Labs (all labs ordered are listed, but only abnormal results are displayed) Labs Reviewed  URINALYSIS, ROUTINE W REFLEX MICROSCOPIC - Abnormal; Notable for the following components:      Result Value   APPearance HAZY (*)    All other components within normal limits  URINE CULTURE  PREGNANCY, URINE    EKG None  Radiology No results found.  Procedures Procedures (including critical care time)  Medications Ordered in ED Medications  cyclobenzaprine (FLEXERIL) tablet 10 mg (10 mg Oral Given 01/16/20 1813)    ED Course  I have reviewed the triage vital signs and the nursing notes.  Pertinent labs & imaging results that were available during my care of the patient were reviewed by me and considered in my medical decision making (see chart for details).    MDM Rules/Calculators/A&P                          The patient is a 34yo female, PMH otherwise healthy who presents to the ED for back pain.  On my initial evaluation, the patient is hemodynamically stable, afebrile, nontoxic-appearing. Physical exam remarkable for nontender back, normal neuro exam.  Differentials considered include back strain, cauda equina/conus medullaris, epidural  abscess, malignancy, UTI, pyelonephritis, pregnancy, other emergent etiology. Due to well appearance, stable vitals, normal exam, low suspicion for emergent etiology, especially with recurrence.   Patient provided flexeril for pain. Urine negative for blood, pregnancy, infection, low suspicion for kidney stones or pyelonephritis or other similar etiology. Do not think patient requires imaging with no traumatic injuries and with normal exam.  Advised patient of concern for benign MSK pain. Advised treatment of symptoms with flexeril and prednisone, which was prescribed, and recommended tylenol and motrin. Recommended follow-up with PCP in the next couple days. Strict return precautions provided. Patient discharged in stable condition.   The care of this patient was overseen by Dr. Rubin Payor, who agreed with evaluation and plan of care.   Final Clinical Impression(s) / ED Diagnoses Final diagnoses:  Chronic midline low back pain without sciatica    Rx / DC Orders ED Discharge Orders         Ordered    cyclobenzaprine (FLEXERIL) 10 MG tablet  3 times daily PRN        01/16/20 1937    predniSONE (DELTASONE) 10 MG tablet  Daily        01/16/20 1937           Loletha Carrow, MD 01/16/20 2251    Benjiman Core, MD 01/16/20 [phone]

## 2020-01-17 LAB — URINE CULTURE: Culture: <10000 — AB

## 2020-05-18 ENCOUNTER — Other Ambulatory Visit: Payer: Self-pay | Admitting: Family Medicine

## 2020-05-18 DIAGNOSIS — R519 Headache, unspecified: Secondary | ICD-10-CM

## 2020-05-18 DIAGNOSIS — R42 Dizziness and giddiness: Secondary | ICD-10-CM

## 2020-06-01 ENCOUNTER — Encounter: Payer: Self-pay | Admitting: Neurology

## 2020-06-02 ENCOUNTER — Other Ambulatory Visit: Payer: Self-pay | Admitting: Family Medicine

## 2020-06-02 ENCOUNTER — Ambulatory Visit
Admission: RE | Admit: 2020-06-02 | Discharge: 2020-06-02 | Disposition: A | Discharge status: Home / Self Care | Payer: Medicaid Other | Source: Ambulatory Visit | Attending: Family Medicine | Admitting: Family Medicine

## 2020-06-02 DIAGNOSIS — M545 Low back pain, unspecified: Secondary | ICD-10-CM

## 2020-06-02 DIAGNOSIS — R42 Dizziness and giddiness: Secondary | ICD-10-CM

## 2020-06-02 DIAGNOSIS — R519 Headache, unspecified: Secondary | ICD-10-CM

## 2020-08-04 NOTE — Progress Notes (Addendum)
NEUROLOGY CONSULTATION NOTE  Erin Carney MRN: [id number] DOB: [birth year]  Referring provider: Payton Emerald, MD Primary care provider: Payton Emerald, MD  Reason for consult:  Headache, dizziness  Assessment/Plan:   1.  Chronic migraine 2.  Vertigo  3.  Hearing loss (left greater than right) - CT head showed nothing obvious.  Defer MRI for now and will first have her get an evaluation with ENT. 4.  Left sided sciatica  1.  Will start topiramate, titrating to 50mg  at bedtime.  We can increase to 100mg  at bedtime if needed. 2.  For migraine rescue, sumatriptan 100mg  and Zofran 4mg  3.  Refer to ENT for evaluation of hearing loss.   4.  Limit use of pain relievers to no more than 2 days out of week to prevent risk of rebound or medication-overuse headache. 5.  Keep headache diary 6.  Refer to physical therapy regarding sciatica 7.  Follow up with me in 6 months.  Subjective:  Erin Carney is a 35 year old right-handed female who presents for dizziness and headaches.  History supplemented by referring provider's note.  She is accompanied by a Spanish-speaking interpreter.  Symptoms started in October 2021.  Dizziness occurs separately or with headache.  Headaches are mild to severe bitemporal pressure with nausea, phonophobia and sometimes sees lights but no vomiting, photophobia, numbness or weakness.  They last all day and may occur 2-3 days a week or up to the entire week.  She averages about 15 headache days a month.  Bending over causes increased head pressure.  Dizziness is described as a spinning sensation.  It occurs when sitting still and is aggravated with head movements.  It occurs twice a day and lasts up to 5 minutes.  She also reports itching in her left ear.  She also notes some reduced hearing loss bilaterally but worse in the left ear.  Epley maneuver helpful but only temporarily.  CT head on 06/02/2020 personally reviewed was normal.    She also  sometimes has left sciatica that is aggravated when she lifts her children.  Current medications:  Meclizine (makes her sleepy), Tylenol Past medications:  Promethazine (made her feel "bad")   PAST MEDICAL HISTORY: Past Medical History:  Diagnosis Date  . Medical history non-contributory     PAST SURGICAL HISTORY: Past Surgical History:  Procedure Laterality Date  . NO PAST SURGERIES      MEDICATIONS: Current Outpatient Medications on File Prior to Visit  Medication Sig Dispense Refill  . cephALEXin (KEFLEX) 500 MG capsule Take 1 capsule (500 mg total) by mouth 4 (four) times daily. (Patient not taking: Reported on 04/26/2017) 28 capsule 0  . ibuprofen (ADVIL,MOTRIN) 600 MG tablet Take 1 tablet (600 mg total) by mouth every 6 (six) hours as needed for mild pain, moderate pain or cramping. 60 tablet 2  . norethindrone (MICRONOR,CAMILA,ERRIN) 0.35 MG tablet Take 1 tablet (0.35 mg total) by mouth daily. 1 Package 11  . Prenatal Vit-Fe Fumarate-FA (PRENATAL MULTIVITAMIN) TABS tablet Take 1 tablet by mouth daily at 12 noon.    . promethazine (PHENERGAN) 25 MG tablet Take 1 tablet (25 mg total) by mouth every 6 (six) hours as needed for nausea or vomiting. (Patient not taking: Reported on 04/26/2017) 30 tablet 0   No current facility-administered medications on file prior to visit.    ALLERGIES: No Known Allergies  FAMILY HISTORY: Family History  Problem Relation Age of Onset  . Alcohol abuse Neg Hx   .  Arthritis Neg Hx   . Asthma Neg Hx   . Birth defects Neg Hx   . Cancer Neg Hx   . COPD Neg Hx   . Depression Neg Hx   . Diabetes Neg Hx   . Drug abuse Neg Hx   . Early death Neg Hx   . Hearing loss Neg Hx   . Heart disease Neg Hx   . Hyperlipidemia Neg Hx   . Hypertension Neg Hx   . Kidney disease Neg Hx   . Learning disabilities Neg Hx   . Mental illness Neg Hx   . Mental retardation Neg Hx   . Miscarriages / Stillbirths Neg Hx   . Stroke Neg Hx   . Vision loss Neg Hx    . Varicose Veins Neg Hx     Objective:  Blood pressure 108/71, pulse 74, height 5\' 4"  (1.626 m), weight 186 lb 6.4 oz (84.6 kg), SpO2 97 %, unknown if currently breastfeeding. General: No acute distress.  Patient appears well-groomed.   Head:  Normocephalic/atraumatic Eyes:  fundi examined but not visualized Neck: supple, no paraspinal tenderness, full range of motion Back: No paraspinal tenderness Heart: regular rate and rhythm Lungs: Clear to auscultation bilaterally. Vascular: No carotid bruits. Neurological Exam: Mental status: alert and oriented to person, place, and time, recent and remote memory intact, fund of knowledge intact, attention and concentration intact, speech fluent and not dysarthric, language intact. Cranial nerves: CN I: not tested CN II: pupils equal, round and reactive to light, visual fields intact CN III, IV, VI:  full range of motion, no nystagmus, no ptosis CN V: facial sensation intact. CN VII: upper and lower face symmetric CN VIII: hearing reduced on the left CN IX, X: gag intact, uvula midline CN XI: sternocleidomastoid and trapezius muscles intact CN XII: tongue midline Bulk & Tone: normal, no fasciculations. Motor:  muscle strength 5/5 throughout Sensation:  Pinprick, temperature and vibratory sensation intact. Deep Tendon Reflexes:  2+ throughout,  toes downgoing.   Finger to nose testing:  Without dysmetria.   Heel to shin:  Without dysmetria.   Gait:  Normal station and stride.  Romberg negative.    Thank you for allowing me to take part in the care of this patient.  , DO  CC: Shon Millet, MD

## 2020-08-08 ENCOUNTER — Other Ambulatory Visit: Payer: Self-pay

## 2020-08-08 ENCOUNTER — Ambulatory Visit: Payer: Medicaid Other | Admitting: Neurology

## 2020-08-08 ENCOUNTER — Telehealth: Payer: Self-pay | Admitting: Physical Therapy

## 2020-08-08 ENCOUNTER — Encounter: Payer: Self-pay | Admitting: Neurology

## 2020-08-08 ENCOUNTER — Other Ambulatory Visit: Payer: Self-pay | Admitting: Neurology

## 2020-08-08 VITALS — BP 108/71 | HR 74 | Ht 64.0 in | Wt 186.4 lb

## 2020-08-08 DIAGNOSIS — R42 Dizziness and giddiness: Secondary | ICD-10-CM | POA: Diagnosis not present

## 2020-08-08 DIAGNOSIS — H918X2 Other specified hearing loss, left ear: Secondary | ICD-10-CM | POA: Diagnosis not present

## 2020-08-08 DIAGNOSIS — M5432 Sciatica, left side: Secondary | ICD-10-CM | POA: Diagnosis not present

## 2020-08-08 DIAGNOSIS — G43109 Migraine with aura, not intractable, without status migrainosus: Secondary | ICD-10-CM

## 2020-08-08 MED ORDER — SUMATRIPTAN SUCCINATE 100 MG PO TABS: 100.0000 mg | ORAL_TABLET | ORAL | 5 refills | Status: DC | PRN

## 2020-08-08 MED ORDER — ONDANSETRON HCL 4 MG PO TABS: 4.0000 mg | ORAL_TABLET | Freq: Three times a day (TID) | ORAL | 5 refills | Status: DC | PRN

## 2020-08-08 MED ORDER — TOPIRAMATE 50 MG PO TABS: ORAL_TABLET | ORAL | 0 refills | Status: DC

## 2020-08-08 NOTE — Patient Instructions (Signed)
Comience a tomar topiramato en tabletas de 50 mg. Tome 1/2 tableta a la hora de acostarse durante [place], luego aumente a 1 tableta a la hora de [place]. Contcteme a fin de mes con la actualizacin y podemos aumentar la dosis si es necesario. Tome 100 mg de sumatriptn al inicio ms temprano del dolor de [place]. Puede repetir la dosis una vez cada 2 horas si es necesario. Mximo 2 comprimidos en 24 horas. Tome ondansetrn para las nuseas segn las indicaciones. Refirase a odo, nariz y garganta para prdida [place] Referir a fisioterapia para la citica Limite el uso de analgsicos a no ms de 2 das a Lawyer. Estos medicamentos incluyen paracetamol, AINE (ibuprofeno/Advil/Motrin, naproxeno/Aleve, triptanos (Imitrex/sumatriptn), Excedrin y narcticos. Esto ayudar a Software engineer de dolores de cabeza de rebote. Tenga en cuenta los desencadenantes alimentarios comunes: - Cafena: caf, t negro, cola, Mt. Dew - Chocolate - Lcteos: quesos aejos (brie, blue, cheddar, gouda, parmasan, provolone, [place], suizo, etc.), [employer], buttermilk, sour cream, limit huevos y Dentist - [place], [place] de man - Alcohol - Cereales/granos: panes FRESCOS (bagels frescos, masa fermentada, donuts), producciones de levadura - Carnes procesadas/enlatadas/envejecidas/curadas (fiambres preenvasados, perritos calientes) - MSG/glutamato: salsa de soja, potenciador del sabor, alimentos encurtidos/en conserva/marinados - Edulcorantes: aspartamo (Equal, Nutrasweet). Sugar y Splenda estn bien - Verduras: legumbres (habas, lentejas, guisantes, habas, guisantes pintos, guisantes, garbanzos), chucrut, cebollas, aceitunas, pepinillos - Fruta: aguacates, pltanos, ctricos (naranja, limn, pomelo), mango - Otros: Comidas congeladas, macarrones con queso ejercicio de rutina Mantngase adecuadamente hidratado (objetivo de 64 oz de agua al da) Mantenga un diario de dolor de Museum/gallery exhibitions officer un manejo  adecuado del estrs. Mantener una adecuada higiene del sueo No saltes comidas Considere suplementos: citrato de magnesio 400 mg al da, riboflavina 400 mg al da, coenzima Q10 100 mg tres veces al [employer].       1. Start topiramate 50mg  tablet.  Take 1/2 tablet at bedtime for one week, then increase to 1 tablet at bedtime.  Contact me at end of month with update and we can increase dose if needed. 2. Take sumatriptan 100mg  at earliest onset of headache.  May repeat dose once in 2 hours if needed.  Maximum 2 tablets in 24 hours. 3. Take ondansetron for nausea as directed 4. Refer to ear, nose an throat for hearing loss 5. Refer to physical therapy for sciatica 6. Limit use of pain relievers to no more than 2 days out of the week.  These medications include acetaminophen, NSAIDs (ibuprofen/Advil/Motrin, naproxen/Aleve, triptans (Imitrex/sumatriptan), Excedrin, and narcotics.  This will help reduce risk of rebound headaches. 7. Be aware of common food triggers:  - Caffeine:  coffee, black tea, cola, Mt. Dew  - Chocolate  - Dairy:  aged cheeses (brie, blue, cheddar, gouda, [place], provolone, [place], Swiss, etc), chocolate milk, buttermilk, sour cream, limit eggs and yogurt  - Nuts, peanut butter  - Alcohol  - Cereals/grains:  FRESH breads (fresh bagels, sourdough, doughnuts), yeast productions  - Processed/canned/aged/cured meats (pre-packaged deli meats, hotdogs)  - MSG/glutamate:  soy sauce, flavor enhancer, pickled/preserved/marinated foods  - Sweeteners:  aspartame (Equal, Nutrasweet).  Sugar and Splenda are okay  - Vegetables:  legumes (lima beans, lentils, snow peas, fava beans, pinto peans, peas, garbanzo beans), sauerkraut, onions, olives, pickles  - Fruit:  avocados, bananas, citrus fruit (orange, lemon, grapefruit), mango  - Other:  Frozen meals, macaroni and cheese 8. Routine exercise 9. Stay adequately hydrated (aim for 64 oz water daily) 10. Keep headache diary 11.  Maintain  proper stress management 12. Maintain proper sleep hygiene 13. Do not skip meals 14. Consider supplements:  magnesium citrate 400mg  daily, riboflavin 400mg  daily, coenzyme Q10 100mg  three times daily.

## 2020-08-08 NOTE — Telephone Encounter (Signed)
I would normally agree with you.  The only problem is that she has Medicaid, which commonly only approves one session of physical therapy.  For the sciatica, I was hoping that she can get a session and then given instructions for home exercises.  Will Medicaid approve another session of PT for a different complaint (dizziness), I don't know until we make the referral.  I will reach out to the patient to find out which she would like to address.

## 2020-10-27 ENCOUNTER — Observation Stay (HOSPITAL_COMMUNITY)
Admission: EM | Admit: 2020-10-27 | Discharge: 2020-10-29 | Disposition: A | Discharge status: Home / Self Care | Payer: Medicaid Other | Attending: Family Medicine | Admitting: Family Medicine

## 2020-10-27 ENCOUNTER — Emergency Department (HOSPITAL_COMMUNITY): Payer: Medicaid Other

## 2020-10-27 ENCOUNTER — Encounter (HOSPITAL_COMMUNITY): Payer: Self-pay | Admitting: *Deleted

## 2020-10-27 ENCOUNTER — Other Ambulatory Visit: Payer: Self-pay

## 2020-10-27 DIAGNOSIS — Z20822 Contact with and (suspected) exposure to covid-19: Secondary | ICD-10-CM | POA: Insufficient documentation

## 2020-10-27 DIAGNOSIS — K869 Disease of pancreas, unspecified: Secondary | ICD-10-CM | POA: Diagnosis not present

## 2020-10-27 DIAGNOSIS — R935 Abnormal findings on diagnostic imaging of other abdominal regions, including retroperitoneum: Secondary | ICD-10-CM | POA: Diagnosis not present

## 2020-10-27 DIAGNOSIS — R1032 Left lower quadrant pain: Secondary | ICD-10-CM | POA: Diagnosis present

## 2020-10-27 DIAGNOSIS — K8689 Other specified diseases of pancreas: Secondary | ICD-10-CM | POA: Diagnosis present

## 2020-10-27 LAB — COMPREHENSIVE METABOLIC PANEL
ALT: 17 U/L (ref 0–44)
AST: 18 U/L (ref 15–41)
Albumin: 4.1 g/dL (ref 3.5–5.0)
Alkaline Phosphatase: 69 U/L (ref 38–126)
Anion gap: 8 (ref 5–15)
BUN: 10 mg/dL (ref 6–20)
CO2: 22 mmol/L (ref 22–32)
Calcium: 8.7 mg/dL — ABNORMAL LOW (ref 8.9–10.3)
Chloride: 101 mmol/L (ref 98–111)
Creatinine, Ser: 0.59 mg/dL (ref 0.44–1.00)
GFR, Estimated: >60 mL/min (ref >60)
Glucose, Bld: 89 mg/dL (ref 70–99)
Potassium: 3.7 mmol/L (ref 3.5–5.1)
Sodium: 131 mmol/L — ABNORMAL LOW (ref 135–145)
Total Bilirubin: 0.7 mg/dL (ref 0.3–1.2)
Total Protein: 7.3 g/dL (ref 6.5–8.1)

## 2020-10-27 LAB — CBC WITH DIFFERENTIAL/PLATELET
Abs Immature Granulocytes: 0.01 10*3/uL (ref 0.00–0.07)
Basophils Absolute: 0 10*3/uL (ref 0.0–0.1)
Basophils Relative: 1 %
Eosinophils Absolute: 0.1 10*3/uL (ref 0.0–0.5)
Eosinophils Relative: 2 %
HCT: 39.9 % (ref 36.0–46.0)
Hemoglobin: 13.4 g/dL (ref 12.0–15.0)
Immature Granulocytes: 0 %
Lymphocytes Relative: 38 %
Lymphs Abs: 1.8 10*3/uL (ref 0.7–4.0)
MCH: 31.4 pg (ref 26.0–34.0)
MCHC: 33.6 g/dL (ref 30.0–36.0)
MCV: 93.4 fL (ref 80.0–100.0)
Monocytes Absolute: 0.4 10*3/uL (ref 0.1–1.0)
Monocytes Relative: 8 %
Neutro Abs: 2.4 10*3/uL (ref 1.7–7.7)
Neutrophils Relative %: 51 %
Platelets: 230 10*3/uL (ref 150–400)
RBC: 4.27 MIL/uL (ref 3.87–5.11)
RDW: 12.3 % (ref 11.5–15.5)
WBC: 4.7 10*3/uL (ref 4.0–10.5)
nRBC: 0 % (ref 0.0–0.2)

## 2020-10-27 LAB — URINALYSIS, ROUTINE W REFLEX MICROSCOPIC
Bilirubin Urine: NEGATIVE
Glucose, UA: NEGATIVE mg/dL
Hgb urine dipstick: NEGATIVE
Ketones, ur: NEGATIVE mg/dL
Nitrite: NEGATIVE
Protein, ur: NEGATIVE mg/dL
Specific Gravity, Urine: 1.018 (ref 1.005–1.030)
pH: 6 (ref 5.0–8.0)

## 2020-10-27 LAB — I-STAT BETA HCG BLOOD, ED (MC, WL, AP ONLY): I-stat hCG, quantitative: <5 m[IU]/mL (ref <5)

## 2020-10-27 LAB — LIPASE, BLOOD: Lipase: 41 U/L (ref 11–51)

## 2020-10-27 MED ORDER — KETOROLAC TROMETHAMINE 15 MG/ML IJ SOLN
15.0000 mg | Freq: Once | INTRAMUSCULAR | Status: AC
  Administered 2020-10-27: 15 mg via INTRAVENOUS
  Filled 2020-10-27: qty 1

## 2020-10-27 MED ORDER — IOHEXOL 350 MG/ML SOLN
75.0000 mL | Freq: Once | INTRAVENOUS | Status: AC | PRN
  Administered 2020-10-27: 75 mL via INTRAVENOUS

## 2020-10-27 MED ORDER — ONDANSETRON HCL 4 MG/2ML IJ SOLN
4.0000 mg | Freq: Once | INTRAMUSCULAR | Status: AC
  Administered 2020-10-27: 4 mg via INTRAVENOUS
  Filled 2020-10-27: qty 2

## 2020-10-27 MED ORDER — MORPHINE SULFATE (PF) 4 MG/ML IV SOLN
4.0000 mg | Freq: Once | INTRAVENOUS | Status: AC
  Administered 2020-10-27: 4 mg via INTRAVENOUS
  Filled 2020-10-27: qty 1

## 2020-10-27 NOTE — ED Notes (Signed)
Pt c/o worsening abdominal pain. MD made aware and RN requested possible medications for pain

## 2020-10-27 NOTE — ED Provider Notes (Signed)
Emergency Medicine Provider Triage Evaluation Note  Erin Carney , a 35 y.o. female  was evaluated in triage.  Pt complains of vertiginous symptoms intermittently for the past several months also with 5 days of left lower quadrant abdominal pain seems to have separate low back pain as well.  Denies any vaginal discharge denies any urinary frequency urgency or dysuria.  Endorses some occasional nausea but no vomiting.  Denies any diarrhea or blood in stool.  No history of diverticulitis that she is aware of.  No fevers or chills.  She states the pain is severe and constant.  Review of Systems  Positive: Abdominal pain, joint, dizziness Negative: fever  Physical Exam  BP 102/82 (BP Location: Left Arm)   Pulse 63   Temp 98.7 F (37.1 C)   Resp 16   Ht 5\' 4"  (1.626 m)   Wt 84.6 kg   LMP 09/26/2020   SpO2 100%   BMI 32.01 kg/m  Gen:   Awake, no distress   Resp:  Normal effort  MSK:   Moves extremities without difficulty  Other:  Focal tenderness in the left lower quadrant of the abdomen.  Seems to be more abdominal than pelvic.  Medical Decision Making  Medically screening exam initiated at 6:28 PM.  Appropriate orders placed.  Erin Carney was informed that the remainder of the evaluation will be completed by another provider, this initial triage assessment does not replace that evaluation, and the importance of remaining in the ED until their evaluation is complete.  Patient is quite tender in the left lower quadrant.  Will obtain abdominal imaging in the form of CT scan with contrast and abdominal labs.  Including urinalysis.   Hayden Rasmussen, Gailen Shelter 10/27/20 1835    10/29/20, MD 10/27/20 [phone]

## 2020-10-27 NOTE — ED Triage Notes (Signed)
Thwe pt is c/o abdomen and back pain for 5 days  lmp last month

## 2020-10-28 ENCOUNTER — Observation Stay (HOSPITAL_COMMUNITY): Payer: Medicaid Other

## 2020-10-28 ENCOUNTER — Encounter (HOSPITAL_COMMUNITY): Payer: Self-pay | Admitting: Internal Medicine

## 2020-10-28 DIAGNOSIS — R935 Abnormal findings on diagnostic imaging of other abdominal regions, including retroperitoneum: Secondary | ICD-10-CM

## 2020-10-28 DIAGNOSIS — K8689 Other specified diseases of pancreas: Secondary | ICD-10-CM | POA: Diagnosis not present

## 2020-10-28 DIAGNOSIS — K869 Disease of pancreas, unspecified: Secondary | ICD-10-CM | POA: Diagnosis not present

## 2020-10-28 DIAGNOSIS — R1032 Left lower quadrant pain: Secondary | ICD-10-CM | POA: Diagnosis not present

## 2020-10-28 LAB — WET PREP, GENITAL
Clue Cells Wet Prep HPF POC: NONE SEEN
Sperm: NONE SEEN
Trich, Wet Prep: NONE SEEN

## 2020-10-28 LAB — HIV ANTIBODY (ROUTINE TESTING W REFLEX): HIV Screen 4th Generation wRfx: NONREACTIVE

## 2020-10-28 LAB — SARS CORONAVIRUS 2 (TAT 6-24 HRS): SARS Coronavirus 2: NEGATIVE

## 2020-10-28 MED ORDER — ACETAMINOPHEN 325 MG PO TABS
650.0000 mg | ORAL_TABLET | Freq: Four times a day (QID) | ORAL | Status: DC | PRN
  Administered 2020-10-28 – 2020-10-29 (×3): 650 mg via ORAL
  Filled 2020-10-28 (×3): qty 2

## 2020-10-28 MED ORDER — ACETAMINOPHEN 650 MG RE SUPP: 650.0000 mg | Freq: Four times a day (QID) | RECTAL | Status: DC | PRN

## 2020-10-28 MED ORDER — MORPHINE SULFATE (PF) 2 MG/ML IV SOLN: 2.0000 mg | INTRAVENOUS | Status: DC | PRN

## 2020-10-28 MED ORDER — GADOBUTROL 1 MMOL/ML IV SOLN
8.5000 mL | Freq: Once | INTRAVENOUS | Status: AC | PRN
  Administered 2020-10-28: 8.5 mL via INTRAVENOUS

## 2020-10-28 MED ORDER — TRAZODONE HCL 50 MG PO TABS: 50.0000 mg | ORAL_TABLET | Freq: Every evening | ORAL | Status: DC | PRN

## 2020-10-28 MED ORDER — IBUPROFEN 200 MG PO TABS: 200.0000 mg | ORAL_TABLET | Freq: Four times a day (QID) | ORAL | Status: DC | PRN

## 2020-10-28 MED ORDER — ONDANSETRON HCL 4 MG/2ML IJ SOLN: 4.0000 mg | Freq: Four times a day (QID) | INTRAMUSCULAR | Status: DC | PRN

## 2020-10-28 MED ORDER — DM-GUAIFENESIN ER 30-600 MG PO TB12: 1.0000 | ORAL_TABLET | Freq: Two times a day (BID) | ORAL | Status: DC | PRN

## 2020-10-28 MED ORDER — SENNOSIDES-DOCUSATE SODIUM 8.6-50 MG PO TABS: 1.0000 | ORAL_TABLET | Freq: Every evening | ORAL | Status: DC | PRN

## 2020-10-28 MED ORDER — ONDANSETRON HCL 4 MG PO TABS: 4.0000 mg | ORAL_TABLET | Freq: Four times a day (QID) | ORAL | Status: DC | PRN

## 2020-10-28 MED ORDER — ENOXAPARIN SODIUM 40 MG/0.4ML IJ SOSY
40.0000 mg | PREFILLED_SYRINGE | INTRAMUSCULAR | Status: DC
  Administered 2020-10-28 – 2020-10-29 (×2): 40 mg via SUBCUTANEOUS
  Filled 2020-10-28 (×2): qty 0.4

## 2020-10-28 NOTE — ED Notes (Signed)
Patient transported to MRI 

## 2020-10-28 NOTE — ED Notes (Signed)
EDP and NT at pt bedside for pelvic exam

## 2020-10-28 NOTE — Plan of Care (Signed)
  Problem: Pain Managment: Goal: General experience of comfort will improve Outcome: Progressing   

## 2020-10-28 NOTE — ED Notes (Signed)
RN attempted to call report to [phone] x1.  

## 2020-10-28 NOTE — Progress Notes (Signed)
PROGRESS NOTE    Erin Carney  ZOX:[id number] DOB: [birth year] DOA: 10/27/2020 [facility]   Brief Narrative:  35 year old female otherwise healthy comes to the hospital complains of left lower quadrant abdominal pain.  On CT she was found to have small lesion in the tail of pancreas, right ovarian/adnexal mass dilatation consistent with venous insufficiency.  GI was consulted, MRCP was performed which showed concerns for pancreatic cyst.  For ovarian/adnexal venous congestion, IR recommended outpatient OB/GYN follow-up.   Assessment & Plan:   Principal Problem:   Mass of pancreas Active Problems:   LLQ abdominal pain   Pancreatic mass Left lower abdominal pain - Neoplasm versus cystic.  Seen on CT scan, MRCP showed concerns of cystic mass with repeat scan in 6 months.  Will await further GI input -Wonder if her pain is related to GYN issues?.  Lipase and LFTs is negative  Venous insufficiency of right ovarian/adnexal veins -IR recommends outpatient follow-up with GYN   DVT prophylaxis: enoxaparin (LOVENOX) injection 40 mg Start: 10/28/20 1000 Code Status: Full code Family Communication:  At bedside  Dispo: The patient is from: Home              Anticipated d/c is to: Home              Patient currently is not medically stable to d/c.  Due to some abdominal pain we will slowly advance her diet and see if she tolerates it today.  If she tolerates it hopefully we can send her home tomorrow.   Difficult to place patient No         Subjective: Still having some abdominal discomfort but improved compared to yesterday.  Denies any nausea or vomiting.  Spoke with the patient using Spanish translator ID [phone]  Review of Systems Otherwise negative except as per HPI, including: General: Denies fever, chills, night sweats or unintended weight loss. Resp: Denies cough, wheezing, shortness of breath. Cardiac: Denies chest pain, palpitations,  orthopnea, paroxysmal nocturnal dyspnea. GI: Denies abdominal pain, nausea, vomiting, diarrhea or constipation GU: Denies dysuria, frequency, hesitancy or incontinence MS: Denies muscle aches, joint pain or swelling Neuro: Denies headache, neurologic deficits (focal weakness, numbness, tingling), abnormal gait Psych: Denies anxiety, depression, SI/HI/AVH Skin: Denies new rashes or lesions ID: Denies sick contacts, exotic exposures, travel  Examination:  General exam: Appears calm and comfortable  Respiratory system: Clear to auscultation. Respiratory effort normal. Cardiovascular system: S1 & S2 heard, RRR. No JVD, murmurs, rubs, gallops or clicks. No pedal edema. Gastrointestinal system: Abdomen is nondistended, soft and nontender. No organomegaly or masses felt. Normal bowel sounds heard. Central nervous system: Alert and oriented. No focal neurological deficits. Extremities: Symmetric 5 x 5 power. Skin: No rashes, lesions or ulcers Psychiatry: Judgement and insight appear normal. Mood & affect appropriate.     Objective: Vitals:   10/28/20 0030 10/28/20 0325 10/28/20 0334 10/28/20 0814  BP: 101/74 99/66 96/62  91/60  Pulse: 63 65 68 (!) 56  Resp:  16 18 17   Temp:  98 F (36.7 C) 97.9 F (36.6 C) 97.8 F (36.6 C)  TempSrc:  Oral Oral Oral  SpO2: 99% 100% 99% 100%  Weight:      Height:        Intake/Output Summary (Last 24 hours) at 10/28/2020 0830 Last data filed at 10/28/2020 0814 Gross per 24 hour  Intake 0 ml  Output --  Net 0 ml   Filed Weights   10/27/20 1813  Weight:  84.6 kg     Data Reviewed:   CBC: Recent Labs  Lab 10/27/20 1835  WBC 4.7  NEUTROABS 2.4  HGB 13.4  HCT 39.9  MCV 93.4  PLT 230   Basic Metabolic Panel: Recent Labs  Lab 10/27/20 1835  NA 131*  K 3.7  CL 101  CO2 22  GLUCOSE 89  BUN 10  CREATININE 0.59  CALCIUM 8.7*   GFR: Estimated Creatinine Clearance: 101.4 mL/min (by C-G formula based on SCr of 0.59 mg/dL). Liver  Function Tests: Recent Labs  Lab 10/27/20 1835  AST 18  ALT 17  ALKPHOS 69  BILITOT 0.7  PROT 7.3  ALBUMIN 4.1   Recent Labs  Lab 10/27/20 1835  LIPASE 41   No results for input(s): AMMONIA in the last 168 hours. Coagulation Profile: No results for input(s): INR, PROTIME in the last 168 hours. Cardiac Enzymes: No results for input(s): CKTOTAL, CKMB, CKMBINDEX, TROPONINI in the last 168 hours. BNP (last 3 results) No results for input(s): PROBNP in the last 8760 hours. HbA1C: No results for input(s): HGBA1C in the last 72 hours. CBG: No results for input(s): GLUCAP in the last 168 hours. Lipid Profile: No results for input(s): CHOL, HDL, LDLCALC, TRIG, CHOLHDL, LDLDIRECT in the last 72 hours. Thyroid Function Tests: No results for input(s): TSH, T4TOTAL, FREET4, T3FREE, THYROIDAB in the last 72 hours. Anemia Panel: No results for input(s): VITAMINB12, FOLATE, FERRITIN, TIBC, IRON, RETICCTPCT in the last 72 hours. Sepsis Labs: No results for input(s): PROCALCITON, LATICACIDVEN in the last 168 hours.  Recent Results (from the past 240 hour(s))  SARS CORONAVIRUS 2 (TAT 6-24 HRS) Nasopharyngeal Nasopharyngeal Swab     Status: None   Collection Time: 10/28/20  1:09 AM   Specimen: Nasopharyngeal Swab  Result Value Ref Range Status   SARS Coronavirus 2 NEGATIVE NEGATIVE Final    Comment: (NOTE) SARS-CoV-2 target nucleic acids are NOT DETECTED.  The SARS-CoV-2 RNA is generally detectable in upper and lower respiratory specimens during the acute phase of infection. Negative results do not preclude SARS-CoV-2 infection, do not rule out co-infections with other pathogens, and should not be used as the sole basis for treatment or other patient management decisions. Negative results must be combined with clinical observations, patient history, and epidemiological information. The expected result is Negative.  Fact Sheet for  Patients: HairSlick.no  Fact Sheet for Healthcare Providers: quierodirigir.com  This test is not yet approved or cleared by the [place] FDA and  has been authorized for detection and/or diagnosis of SARS-CoV-2 by FDA under an Emergency Use Authorization (EUA). This EUA will remain  in effect (meaning this test can be used) for the duration of the COVID-19 declaration under Se ction 564(b)(1) of the Act, 21 U.S.C. section 360bbb-3(b)(1), unless the authorization is terminated or revoked sooner.  Performed at [facility], [address], [place], [place] [address]   Wet prep, genital     Status: Abnormal   Collection Time: 10/28/20  1:20 AM   Specimen: PATH Cytology Cervicovaginal Ancillary Only  Result Value Ref Range Status   Yeast Wet Prep HPF POC PRESENT (A) NONE SEEN Final   Trich, Wet Prep NONE SEEN NONE SEEN Final   Clue Cells Wet Prep HPF POC NONE SEEN NONE SEEN Final   WBC, Wet Prep HPF POC MANY (A) NONE SEEN Final   Sperm NONE SEEN  Final    Comment: Performed at [facility], [address] [place], [place]  [address]         Radiology Studies: CT ABDOMEN PELVIS W CONTRAST  Result Date: 10/27/2020 CLINICAL DATA:  Left lower quadrant abdominal pain EXAM: CT ABDOMEN AND PELVIS WITH CONTRAST TECHNIQUE: Multidetector CT imaging of the abdomen and pelvis was performed using the standard protocol following bolus administration of intravenous contrast. CONTRAST:  54mL OMNIPAQUE IOHEXOL 350 MG/ML SOLN COMPARISON:  None. FINDINGS: Lower chest: No acute abnormality. Hepatobiliary: No focal liver abnormality is seen. No gallstones, gallbladder wall thickening, or biliary dilatation. Pancreas: There is a 1.1 x 1.9 x 1.5 cm indeterminate low-attenuation lesion within the tail of the pancreas best seen on axial image # 25 and coronal image # 82. The pancreas is otherwise unremarkable. No peripancreatic adenopathy  or fluid collections are identified. Spleen: Unremarkable Adrenals/Urinary Tract: Adrenal glands are unremarkable. Kidneys are normal, without renal calculi, focal lesion, or hydronephrosis. Bladder is unremarkable. Stomach/Bowel: Stomach is within normal limits. Appendix appears normal. No evidence of bowel wall thickening, distention, or inflammatory changes. No free intraperitoneal gas or fluid. 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 The right common, external, and internal iliac veins appear patent. The abdominal vasculature is otherwise unremarkable. No pathologic adenopathy within the abdomen and pelvis. Reproductive: Uterus and bilateral adnexa are unremarkable. Other: No abdominal wall hernia.  Rectum is unremarkable. Musculoskeletal: No acute bone abnormality. Osseous structures are age-appropriate. IMPRESSION: No acute intra-abdominal pathology identified. No definite radiographic explanation for the patient's reported symptoms. 19 mm indeterminate low-attenuation lesion within the tail the pancreas. This would be better assessed with EUS or pancreatic protocol MRI examination. 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 Clinical correlation for signs and symptoms of pelvic venous congestion may be helpful. Interventional radiology consultation may also be helpful for further management. Electronically Signed   By: Helyn Numbers M.D.   On: 10/27/2020 22:35   MR ABDOMEN MRCP W WO CONTAST  Result Date: 10/28/2020 CLINICAL DATA:  CT demonstrating a pancreatic tail indeterminate hypoattenuating lesion. EXAM: MRI ABDOMEN WITHOUT AND WITH CONTRAST (INCLUDING MRCP) TECHNIQUE: Multiplanar multisequence MR imaging of the abdomen was performed both before and after the administration of intravenous contrast. Heavily  T2-weighted images of the biliary and pancreatic ducts were obtained, and three-dimensional MRCP images were rendered by post processing. CONTRAST:  8.71mL GADAVIST GADOBUTROL 1 MMOL/ML IV SOLN COMPARISON:  CT of 1 day prior FINDINGS: Lower chest: Normal heart size without pericardial or pleural effusion. Hepatobiliary: Mild hepatic steatosis. No focal liver lesion. Normal gallbladder, without biliary ductal dilatation. Pancreas: Corresponding to the CT abnormality, within the pancreatic tail, is a T2 hyperintense, macrolobulated minimally septated lesion which measures maximally 1.7 cm on 16/5 and 16/4. Demonstrates enhancement within a thin septation including on 57/1200 2. No solid nodular components. No main pancreatic duct dilatation or acute inflammation. Spleen:  Normal in size, without focal abnormality. Adrenals/Urinary Tract: Normal adrenal glands. Normal kidneys, without hydronephrosis. Stomach/Bowel: Normal stomach and abdominal bowel loops. Vascular/Lymphatic: Normal caliber of the aorta and branch vessels. No retroperitoneal or retrocrural adenopathy. Other:  No ascites. Musculoskeletal: No acute osseous abnormality. IMPRESSION: 1. Minimally septated pancreatic tail cystic lesion of 1.7 cm. Differential considerations include pseudocyst or indolent cystic neoplasm. Per consensus criteria, clinical strategies include repeat MRI/MRCP at 6 months versus further evaluation with endoscopic ultrasound. This recommendation follows ACR consensus guidelines: Management of Incidental Pancreatic Cysts: A White  Paper of the ACR Incidental Findings Committee. J Am Coll Radiol 2017;14:911-923. 2.  No acute abdominal process. 3. Mild hepatic steatosis Electronically Signed   By: Jeronimo Greaves M.D.   On: 10/28/2020 05:30        Scheduled Meds:  enoxaparin (LOVENOX) injection  40 mg Subcutaneous Q24H   Continuous Infusions:   LOS: 0 days   Time spent= 35 mins    Jeyla Bulger Joline Maxcy, MD [facility]  If 7PM-7AM, please contact night-coverage  10/28/2020, 8:30 AM

## 2020-10-28 NOTE — H&P (Signed)
History and Physical    Theodore Brock Mokry BSW:[id number] DOB: 1985-04-18 DOA: 10/27/2020  PCP: Department, [facility]  Patient coming from: Home  I have personally briefly reviewed patient's old medical records in [facility]  Chief Complaint: LLQ abd pain  HPI: Erin Carney is a 35 y.o. female with medical history significant of previously healthy.  Pt presents to ED with c/o LLQ abd pain.  Symptoms onset over the past couple of weeks.  Pain has been constant, worsening, severe, now 10/10 with home pain meds.  No radiation, no vaginal bleeding, discharge, nor dysuria.  Has urinary frequency, no current pregnancy.   ED Course: UA neg.  Pelvic exam: mild L adnexal TTP, no R adnexal tenderness.  CT A/P: 1) small lesion of tail of pancreas 2) R ovarian and adnexal vein dilation, likely venous insufficiency.  Per IR: can follow up outpt with OB/GYN for the venous insufficiency.  Per GI: admit and get MRCP for the pancreatic lesion.   Review of Systems: As per HPI, otherwise all review of systems negative.  Past Medical History:  Diagnosis Date   Medical history non-contributory     Past Surgical History:  Procedure Laterality Date   NO PAST SURGERIES       reports that she has never smoked. She has never used smokeless tobacco. She reports that she does not drink alcohol and does not use drugs.  No Known Allergies  Family History  Problem Relation Age of Onset   Alcohol abuse Neg Hx    Arthritis Neg Hx    Asthma Neg Hx    Birth defects Neg Hx    Cancer Neg Hx    COPD Neg Hx    Depression Neg Hx    Diabetes Neg Hx    Drug abuse Neg Hx    Early death Neg Hx    Hearing loss Neg Hx    Heart disease Neg Hx    Hyperlipidemia Neg Hx    Hypertension Neg Hx    Kidney disease Neg Hx    Learning disabilities Neg Hx    Mental illness Neg Hx    Mental retardation Neg Hx    Miscarriages / Stillbirths Neg Hx    Stroke Neg Hx    Vision loss  Neg Hx    Varicose Veins Neg Hx    Pancreatic cancer Neg Hx      Prior to Admission medications   Medication Sig Start Date End Date Taking? Authorizing Provider  acetaminophen (TYLENOL) 500 MG tablet Take 500 mg by mouth every 6 (six) hours as needed for moderate pain or headache.   Yes [provider]  ibuprofen (ADVIL) 200 MG tablet Take 200 mg by mouth every 6 (six) hours as needed for headache or moderate pain.   Yes [provider]    Physical Exam: Vitals:   10/27/20 2050 10/27/20 2130 10/27/20 2300 10/27/20 2345  BP: 94/61 102/63 100/66 99/62  Pulse: 61 63 (!) 56 70  Resp: 15 14 16 14   Temp:      SpO2: 98% 99% 98% 98%  Weight:      Height:        Constitutional: NAD, calm, comfortable Eyes: PERRL, lids and conjunctivae normal ENMT: Mucous membranes are moist. Posterior pharynx clear of any exudate or lesions.Normal dentition.  Neck: normal, supple, no masses, no thyromegaly Respiratory: clear to auscultation bilaterally, no wheezing, no crackles. Normal respiratory effort. No accessory muscle use.  Cardiovascular: Regular rate  and rhythm, no murmurs / rubs / gallops. No extremity edema. 2+ pedal pulses. No carotid bruits.  Abdomen: LLQ TTP Musculoskeletal: no clubbing / cyanosis. No joint deformity upper and lower extremities. Good ROM, no contractures. Normal muscle tone.  Skin: no rashes, lesions, ulcers. No induration Neurologic: CN 2-12 grossly intact. Sensation intact, DTR normal. Strength 5/5 in all 4.  Psychiatric: Normal judgment and insight. Alert and oriented x 3. Normal mood.    Labs on Admission: I have personally reviewed following labs and imaging studies  CBC: Recent Labs  Lab 10/27/20 1835  WBC 4.7  NEUTROABS 2.4  HGB 13.4  HCT 39.9  MCV 93.4  PLT 230   Basic Metabolic Panel: Recent Labs  Lab 10/27/20 1835  NA 131*  K 3.7  CL 101  CO2 22  GLUCOSE 89  BUN 10  CREATININE 0.59  CALCIUM 8.7*   GFR: Estimated  Creatinine Clearance: 101.4 mL/min (by C-G formula based on SCr of 0.59 mg/dL). Liver Function Tests: Recent Labs  Lab 10/27/20 1835  AST 18  ALT 17  ALKPHOS 69  BILITOT 0.7  PROT 7.3  ALBUMIN 4.1   Recent Labs  Lab 10/27/20 1835  LIPASE 41   No results for input(s): AMMONIA in the last 168 hours. Coagulation Profile: No results for input(s): INR, PROTIME in the last 168 hours. Cardiac Enzymes: No results for input(s): CKTOTAL, CKMB, CKMBINDEX, TROPONINI in the last 168 hours. BNP (last 3 results) No results for input(s): PROBNP in the last 8760 hours. HbA1C: No results for input(s): HGBA1C in the last 72 hours. CBG: No results for input(s): GLUCAP in the last 168 hours. Lipid Profile: No results for input(s): CHOL, HDL, LDLCALC, TRIG, CHOLHDL, LDLDIRECT in the last 72 hours. Thyroid Function Tests: No results for input(s): TSH, T4TOTAL, FREET4, T3FREE, THYROIDAB in the last 72 hours. Anemia Panel: No results for input(s): VITAMINB12, FOLATE, FERRITIN, TIBC, IRON, RETICCTPCT in the last 72 hours. Urine analysis:    Component Value Date/Time   COLORURINE YELLOW 10/27/2020 1743   APPEARANCEUR CLEAR 10/27/2020 1743   LABSPEC 1.018 10/27/2020 1743   PHURINE 6.0 10/27/2020 1743   GLUCOSEU NEGATIVE 10/27/2020 1743   HGBUR NEGATIVE 10/27/2020 1743   BILIRUBINUR NEGATIVE 10/27/2020 1743   BILIRUBINUR neg 04/27/2013 1314   KETONESUR NEGATIVE 10/27/2020 1743   PROTEINUR NEGATIVE 10/27/2020 1743   UROBILINOGEN 0.2 08/11/2014 1905   NITRITE NEGATIVE 10/27/2020 1743   LEUKOCYTESUR SMALL (A) 10/27/2020 1743    Radiological Exams on Admission: CT ABDOMEN PELVIS W CONTRAST  Result Date: 10/27/2020 CLINICAL DATA:  Left lower quadrant abdominal pain EXAM: CT ABDOMEN AND PELVIS WITH CONTRAST TECHNIQUE: Multidetector CT imaging of the abdomen and pelvis was performed using the standard protocol following bolus administration of intravenous contrast. CONTRAST:  65mL OMNIPAQUE  IOHEXOL 350 MG/ML SOLN COMPARISON:  None. FINDINGS: Lower chest: No acute abnormality. Hepatobiliary: No focal liver abnormality is seen. No gallstones, gallbladder wall thickening, or biliary dilatation. Pancreas: There is a 1.1 x 1.9 x 1.5 cm indeterminate low-attenuation lesion within the tail of the pancreas best seen on axial image # 25 and coronal image # 82. The pancreas is otherwise unremarkable. No peripancreatic adenopathy or fluid collections are identified. Spleen: Unremarkable Adrenals/Urinary Tract: Adrenal glands are unremarkable. Kidneys are normal, without renal calculi, focal lesion, or hydronephrosis. Bladder is unremarkable. Stomach/Bowel: Stomach is within normal limits. Appendix appears normal. No evidence of bowel wall thickening, distention, or inflammatory changes. No free intraperitoneal gas or fluid. 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 The right common, external, and internal iliac veins appear patent. The abdominal vasculature is otherwise unremarkable. No pathologic adenopathy within the abdomen and pelvis. Reproductive: Uterus and bilateral adnexa are unremarkable. Other: No abdominal wall hernia.  Rectum is unremarkable. Musculoskeletal: No acute bone abnormality. Osseous structures are age-appropriate. IMPRESSION: No acute intra-abdominal pathology identified. No definite radiographic explanation for the patient's reported symptoms. 19 mm indeterminate low-attenuation lesion within the tail the pancreas. This would be better assessed with EUS or pancreatic protocol MRI examination. 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 Clinical correlation for signs and symptoms of pelvic venous congestion may be helpful. Interventional radiology consultation may also be helpful for further management.  Electronically Signed   By: Helyn Numbers M.D.   On: 10/27/2020 22:35    EKG: Independently reviewed.  Assessment/Plan Principal Problem:   Mass of pancreas Active Problems:   LLQ abdominal pain    Mass of pancreas - Per Dr. Chales Abrahams: admit and get MRCP Will also order CA 19-9, AFP, CEA LLQ abd pain - Unclear cause Lipase negative, no epigastric pain, doubtful that the small pancreatic lesion is causing this. Perhaps referred pain from the R sided venous insufficiency? PRN morphine for pain for the moment Venous insufficiency of R ovary and adnexa - No R sided symptoms F/u OB/GYN as outpt  DVT prophylaxis: Lovenox Code Status: Full Family Communication: Family at bedside Disposition Plan: Home after GI work up and sign off Consults called: EDP spoke with Dr. Chales Abrahams who will see in consult for GI Admission status: Place in 14    Cochise Dinneen M. DO [facility]  How to contact the [facility] Attending or Consulting provider 7A - 7P or covering provider during after hours 7P -7A, for this patient?  Check the care team in [facility] and look for a) attending/consulting [facility] provider listed and b) the [facility] team listed Log into [email or web address]  Amion Physician Scheduling and messaging for groups and whole hospitals  On call and physician scheduling software for group practices, residents, hospitalists and other medical providers for call, clinic, rotation and shift schedules. On[employer] is a hospital-wide system for scheduling doctors and paging doctors on call. EasyPlot is for scientific plotting and data analysis.  [email or web address]  and use [place]'s universal password to access. If you do not have the password, please contact the hospital operator.  Locate the [facility] provider you are looking for under [facility] and page to a number that you can be directly reached. If you still have difficulty reaching the provider, please page the [facility] (Director on Call) for the Hospitalists  listed on amion for assistance.  10/28/2020, 1:39 AM

## 2020-10-28 NOTE — ED Provider Notes (Signed)
PELVIC EXAM:  Thin, greenish cervical discharge without CMT.  No right adnexal tenderness Left adnexal tenderness mild - no mass or fullness No vulvar abnormalities   Elpidio Anis, PA-C 10/28/20 0113    Franne Forts, DO 10/28/20 2137

## 2020-10-28 NOTE — ED Notes (Signed)
Pt remains in MRI at this time  

## 2020-10-28 NOTE — ED Provider Notes (Signed)
[facility] EMERGENCY DEPARTMENT Provider Note   CSN: [id number] Arrival date & time: 10/27/20  1743     History Chief Complaint  Patient presents with   Abdominal Pain    Erin Carney is a 35 y.o. female.  Patient is a 35 yo female with pmh as listed below presenting for LLQ abdominal pain. Patient admits to LLQ abdominal pain that started over the past weeks, constant, worsening, severe, and now described as 10/10 uncontrolled with home pain mediations. Pain is nontradiating. No vaginal discharge, bleeding, or dysuria. Admits to urinary frequency. Denies current pregnancy.   The history is provided by the patient. No language interpreter was used.  Abdominal Pain Pain location:  LLQ Pain quality: aching   Pain radiates to:  Does not radiate Pain severity:  Severe Timing:  Constant Associated symptoms: no chest pain, no chills, no cough, no dysuria, no fever, no hematuria, no shortness of breath, no sore throat, no vaginal bleeding and no vomiting       Past Medical History:  Diagnosis Date   Medical history non-contributory     Patient Active Problem List   Diagnosis Date Noted   Preterm labor with preterm delivery second trimester 04/29/2017   Chorioamnionitis in second trimester 04/29/2017   Premature rupture of membranes 04/25/2017   Preterm premature rupture of membranes (PPROM) delivered, current hospitalization 02/24/2015   Second-degree perineal laceration, with delivery 02/22/2015    Past Surgical History:  Procedure Laterality Date   NO PAST SURGERIES       OB History     Gravida  5   Para  3   Term  3   Preterm  0   AB  1   Living  3      SAB  1   IAB      Ectopic      Multiple  0   Live Births  3           Family History  Problem Relation Age of Onset   Alcohol abuse Neg Hx    Arthritis Neg Hx    Asthma Neg Hx    Birth defects Neg Hx    Cancer Neg Hx    COPD Neg Hx    Depression Neg Hx     Diabetes Neg Hx    Drug abuse Neg Hx    Early death Neg Hx    Hearing loss Neg Hx    Heart disease Neg Hx    Hyperlipidemia Neg Hx    Hypertension Neg Hx    Kidney disease Neg Hx    Learning disabilities Neg Hx    Mental illness Neg Hx    Mental retardation Neg Hx    Miscarriages / Stillbirths Neg Hx    Stroke Neg Hx    Vision loss Neg Hx    Varicose Veins Neg Hx     Social History   Tobacco Use   Smoking status: Never   Smokeless tobacco: Never  Substance Use Topics   Alcohol use: No   Drug use: No    Home Medications Prior to Admission medications   Medication Sig Start Date End Date Taking? Authorizing Provider  cephALEXin (KEFLEX) 500 MG capsule Take 1 capsule (500 mg total) by mouth 4 (four) times daily. Patient not taking: Reported on 04/26/2017 12/06/16   Judeth Horn, NP  ibuprofen (ADVIL,MOTRIN) 600 MG tablet Take 1 tablet (600 mg total) by mouth every 6 (six) hours as needed for  mild pain, moderate pain or cramping. 04/29/17   Anyanwu, Jethro BastosUgonna A, MD  meclizine (ANTIVERT) 12.5 MG tablet Take 12.5 mg by mouth as needed. 05/10/20   [provider]  norethindrone (MICRONOR,CAMILA,ERRIN) 0.35 MG tablet Take 1 tablet (0.35 mg total) by mouth daily. 04/29/17   Anyanwu, Jethro BastosUgonna A, MD  ondansetron (ZOFRAN) 4 MG tablet Take 1 tablet (4 mg total) by mouth every 8 (eight) hours as needed for nausea or vomiting. 08/08/20   Drema DallasJaffe, Adam R, DO  Prenatal Vit-Fe Fumarate-FA (PRENATAL MULTIVITAMIN) TABS tablet Take 1 tablet by mouth daily at 12 noon.    [provider]  promethazine (PHENERGAN) 25 MG tablet Take 1 tablet (25 mg total) by mouth every 6 (six) hours as needed for nausea or vomiting. Patient not taking: Reported on 04/26/2017 12/06/16   Judeth HornLawrence, Erin, NP  SUMAtriptan (IMITREX) 100 MG tablet Take 1 tablet (100 mg total) by mouth as needed for up to 1 dose for migraine (May repeat in 2 hours.  Maximum 2 tablets in 24 hours.). May repeat in 2 hours if headache  persists or recurs. 08/08/20   Drema DallasJaffe, Adam R, DO  topiramate (TOPAMAX) 50 MG tablet Take 1/2 tablet at bedtime for one week, then increase to 1 tablet at bedtime 08/08/20   Drema DallasJaffe, Adam R, DO    Allergies    Patient has no known allergies.  Review of Systems   Review of Systems  Constitutional:  Negative for chills and fever.  HENT:  Negative for ear pain and sore throat.   Eyes:  Negative for pain and visual disturbance.  Respiratory:  Negative for cough and shortness of breath.   Cardiovascular:  Negative for chest pain and palpitations.  Gastrointestinal:  Positive for abdominal pain. Negative for vomiting.  Genitourinary:  Negative for dysuria, hematuria, vaginal bleeding and vaginal pain.  Musculoskeletal:  Negative for arthralgias and back pain.  Skin:  Negative for color change and rash.  Neurological:  Negative for seizures and syncope.  All other systems reviewed and are negative.  Physical Exam Updated Vital Signs BP 99/62   Pulse 70   Temp 98.7 F (37.1 C)   Resp 14   Ht 5\' 4"  (1.626 m)   Wt 84.6 kg   LMP 09/26/2020   SpO2 98%   BMI 32.01 kg/m   Physical Exam Vitals and nursing note reviewed.  Constitutional:      General: She is not in acute distress.    Appearance: She is well-developed.  HENT:     Head: Normocephalic and atraumatic.  Eyes:     Conjunctiva/sclera: Conjunctivae normal.  Cardiovascular:     Rate and Rhythm: Normal rate and regular rhythm.     Heart sounds: No murmur heard. Pulmonary:     Effort: Pulmonary effort is normal. No respiratory distress.     Breath sounds: Normal breath sounds.  Abdominal:     Palpations: Abdomen is soft.     Tenderness: There is abdominal tenderness in the left lower quadrant. There is no guarding or rebound.  Musculoskeletal:     Cervical back: Neck supple.  Skin:    General: Skin is warm and dry.  Neurological:     Mental Status: She is alert.    ED Results / Procedures / Treatments   Labs (all labs  ordered are listed, but only abnormal results are displayed) Labs Reviewed  COMPREHENSIVE METABOLIC PANEL - Abnormal; Notable for the following components:      Result Value  Sodium 131 (*)    Calcium 8.7 (*)    All other components within normal limits  URINALYSIS, ROUTINE W REFLEX MICROSCOPIC - Abnormal; Notable for the following components:   Leukocytes,Ua SMALL (*)    Bacteria, UA RARE (*)    All other components within normal limits  CBC WITH DIFFERENTIAL/PLATELET  LIPASE, BLOOD  I-STAT BETA HCG BLOOD, ED ([facility], [facility], [facility] ONLY)    EKG None  Radiology CT ABDOMEN PELVIS W CONTRAST  Result Date: 10/27/2020 CLINICAL DATA:  Left lower quadrant abdominal pain EXAM: CT ABDOMEN AND PELVIS WITH CONTRAST TECHNIQUE: Multidetector CT imaging of the abdomen and pelvis was performed using the standard protocol following bolus administration of intravenous contrast. CONTRAST:  31mL OMNIPAQUE IOHEXOL 350 MG/ML SOLN COMPARISON:  None. FINDINGS: Lower chest: No acute abnormality. Hepatobiliary: No focal liver abnormality is seen. No gallstones, gallbladder wall thickening, or biliary dilatation. Pancreas: There is a 1.1 x 1.9 x 1.5 cm indeterminate low-attenuation lesion within the tail of the pancreas best seen on axial image # 25 and coronal image # 82. The pancreas is otherwise unremarkable. No peripancreatic adenopathy or fluid collections are identified. Spleen: Unremarkable Adrenals/Urinary Tract: Adrenal glands are unremarkable. Kidneys are normal, without renal calculi, focal lesion, or hydronephrosis. Bladder is unremarkable. Stomach/Bowel: Stomach is within normal limits. Appendix appears normal. No evidence of bowel wall thickening, distention, or inflammatory changes. No free intraperitoneal gas or fluid. 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 The right common, external, and internal iliac veins  appear patent. The abdominal vasculature is otherwise unremarkable. No pathologic adenopathy within the abdomen and pelvis. Reproductive: Uterus and bilateral adnexa are unremarkable. Other: No abdominal wall hernia.  Rectum is unremarkable. Musculoskeletal: No acute bone abnormality. Osseous structures are age-appropriate. IMPRESSION: No acute intra-abdominal pathology identified. No definite radiographic explanation for the patient's reported symptoms. 19 mm indeterminate low-attenuation lesion within the tail the pancreas. This would be better assessed with EUS or pancreatic protocol MRI examination. 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 Clinical correlation for signs and symptoms of pelvic venous congestion may be helpful. Interventional radiology consultation may also be helpful for further management. Electronically Signed   By: Helyn Numbers M.D.   On: 10/27/2020 22:35    Procedures Procedures   Medications Ordered in ED Medications  iohexol (OMNIPAQUE) 350 MG/ML injection 75 mL (75 mLs Intravenous Contrast Given 10/27/20 2213)  morphine 4 MG/ML injection 4 mg (4 mg Intravenous Given 10/27/20 2326)  ondansetron (ZOFRAN) injection 4 mg (4 mg Intravenous Given 10/27/20 2326)  ketorolac (TORADOL) 15 MG/ML injection 15 mg (15 mg Intravenous Given 10/27/20 2326)    ED Course  I have reviewed the triage vital signs and the nursing notes.  Pertinent labs & imaging results that were available during my care of the patient were reviewed by me and considered in my medical decision making (see chart for details).    MDM Rules/Calculators/A&P                         12:38 AM 35 yo female with pmh as listed below presenting for LLQ abdominal pain. Patient is Aox3, no acute distress, afebrile, with stable vitals. Physicial exam demonstrates soft abdomen with tenderness to palpation of LLQ. CT abdomen  demonstrates gonadal venous congestion to the ovary but on the right side.  I spoke with interventional radiaology who recommends outpatient follow up with OBGYN. Patient currently comfortable on exam with minimal tenderness. Low suspicion torsion. Transvaginal US ordered and pending for the morning. Patient denies any hx of STDs, pelvic pain, or discharge. Offered pelvic exam and testing for STDs and requested. PA to complete vaginal exam. Tests ordered. UA demonstrates no UTI.   Of note, incidental finding of lesion to pancreas tail on CT abdomen. Pt denies family hx of pancreatic cancer. Liver profile and lipase stable. I spoke with GI specialist Dr. Chales Abrahams who recommends admission for MRCP due to possible loss to follow up. I spoke with hospitalist Dr. Julian Reil who agrees to accept patient.         Final Clinical Impression(s) / ED Diagnoses Final diagnoses:  Abnormal CT of the abdomen  Pancreatic lesion  LLQ abdominal pain    Rx / DC Orders ED Discharge Orders     None        Franne Forts, DO 10/28/20 1324

## 2020-10-29 DIAGNOSIS — K8689 Other specified diseases of pancreas: Secondary | ICD-10-CM | POA: Diagnosis not present

## 2020-10-29 LAB — CBC
HCT: 37.1 % (ref 36.0–46.0)
Hemoglobin: 12.5 g/dL (ref 12.0–15.0)
MCH: 31.4 pg (ref 26.0–34.0)
MCHC: 33.7 g/dL (ref 30.0–36.0)
MCV: 93.2 fL (ref 80.0–100.0)
Platelets: 201 10*3/uL (ref 150–400)
RBC: 3.98 MIL/uL (ref 3.87–5.11)
RDW: 12.3 % (ref 11.5–15.5)
WBC: 4.1 10*3/uL (ref 4.0–10.5)
nRBC: 0 % (ref 0.0–0.2)

## 2020-10-29 LAB — BASIC METABOLIC PANEL
Anion gap: 5 (ref 5–15)
BUN: 16 mg/dL (ref 6–20)
CO2: 25 mmol/L (ref 22–32)
Calcium: 8.9 mg/dL (ref 8.9–10.3)
Chloride: 107 mmol/L (ref 98–111)
Creatinine, Ser: 0.75 mg/dL (ref 0.44–1.00)
GFR, Estimated: >60 mL/min (ref >60)
Glucose, Bld: 118 mg/dL — ABNORMAL HIGH (ref 70–99)
Potassium: 3.6 mmol/L (ref 3.5–5.1)
Sodium: 137 mmol/L (ref 135–145)

## 2020-10-29 LAB — CEA: CEA: 1.3 ng/mL (ref 0.0–4.7)

## 2020-10-29 LAB — CANCER ANTIGEN 19-9: CA 19-9: 5 U/mL (ref 0–35)

## 2020-10-29 LAB — AFP TUMOR MARKER: AFP, Serum, Tumor Marker: 1.7 ng/mL (ref 0.0–6.4)

## 2020-10-29 MED ORDER — SODIUM CHLORIDE 0.9 % IV BOLUS: 500.0000 mL | Freq: Once | INTRAVENOUS | Status: DC

## 2020-10-29 MED ORDER — SODIUM CHLORIDE 0.9 % IV BOLUS
500.0000 mL | Freq: Once | INTRAVENOUS | Status: AC
  Administered 2020-10-29: 500 mL via INTRAVENOUS

## 2020-10-29 MED ORDER — FLUCONAZOLE 150 MG PO TABS
150.0000 mg | ORAL_TABLET | Freq: Once | ORAL | Status: AC
  Administered 2020-10-29: 150 mg via ORAL
  Filled 2020-10-29: qty 1

## 2020-10-29 MED ORDER — NITROFURANTOIN MONOHYD MACRO 100 MG PO CAPS
100.0000 mg | ORAL_CAPSULE | Freq: Two times a day (BID) | ORAL | Status: DC
  Administered 2020-10-29: 100 mg via ORAL
  Filled 2020-10-29 (×2): qty 1

## 2020-10-29 MED ORDER — NITROFURANTOIN MONOHYD MACRO 100 MG PO CAPS: 100.0000 mg | ORAL_CAPSULE | Freq: Two times a day (BID) | ORAL | 0 refills | Status: DC

## 2020-10-29 MED ORDER — ALBUMIN HUMAN 5 % IV SOLN
12.5000 g | Freq: Once | INTRAVENOUS | Status: AC
  Administered 2020-10-29: 12.5 g via INTRAVENOUS
  Filled 2020-10-29: qty 250

## 2020-10-29 NOTE — Progress Notes (Signed)
BP much better now after the Albumin, pt is going to be discharged.

## 2020-10-29 NOTE — Progress Notes (Signed)
After NS bolus rechecked Pt BP still low 84/61. Notified on call X. Blount,NP via text page.

## 2020-10-29 NOTE — Discharge Summary (Signed)
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    Discharge Condition: Improved  Diet recommendation: Regular  Vitals:   10/29/20 1200 10/29/20 1225  BP: 96/62 104/65  Pulse: 66 (!) 55  Resp: 16 16  Temp: 98.1 F (36.7 C) 97.8 F (36.6 C)  SpO2: 98% 97%    History of present illness:  35 year old female with complains of left lower quadrant abdominal pain she had a CT scan that found a small lesion in the tail of the pancreas and right ovarian adnexal mass dilated Tatian consistent with venous insufficiency GI was consulted MRCP was performed which showed concerns for queen pancreatic cyst.  Her lipase and LFTs were negative normal for the ovarian/adrenal venous congestion IR recommended for outpatient follow-up with OB/GYN he.  Patient also was noted to have urinary tract infection and was started on nitrofurantoin she also complains of vaginal discharge which was consistent with Candida from the pelvic culture that was done she was given a dose of Diflucan  Hospital Course:  Principal Problem:   Mass of pancreas Active Problems:   LLQ abdominal pain  75-year-old female with complains of left lower quadrant abdominal pain she had a CT scan that found a small lesion in the tail of the pancreas and right ovarian adnexal mass dilated Tatian consistent with venous insufficiency GI was consulted MRCP was performed which showed concerns for queen pancreatic cyst.  Her lipase and LFTs were negative normal for the ovarian/adrenal venous congestion IR recommended for  outpatient follow-up with OB/GYN he.  Patient also was noted to have urinary tract infection and was started on nitrofurantoin she also complains of vaginal discharge which was consistent with Candida from the pelvic culture that was done she was given a dose of Diflucan Discussed with patient at length through the help of an interpreter in Spanish interpreter Procedures: ERCP  Consultations: GI  Discharge Exam: BP 104/65 (BP Location: Left Arm)   Pulse (!) 55   Temp 97.8 F (36.6 C) (Oral)   Resp 16   Ht 5\' 4"  (1.626 m)   Wt 84.6 kg   LMP 09/26/2020   SpO2 97%   BMI 32.01 kg/m   General: Alert oriented x3 no distress Cardiovascular: Regular rate and rhythm Respiratory: Clear to auscultation bilaterally  Discharge Instructions You were cared for by a hospitalist during your hospital stay. If you have any questions about your discharge medications or the care you received while you were in the hospital after you are discharged, you can call the unit and asked to speak with the hospitalist on call if the hospitalist that took care of you is not available. Once you are discharged, your primary care physician will handle any further medical issues. Please note that NO REFILLS for any discharge medications will be authorized once you are discharged, as it is imperative that you return to your primary care physician (or establish a relationship with a primary care physician if you do not have one) for your aftercare needs so that they can reassess your need for medications and monitor your lab values.  Discharge Instructions  Call MD for:  persistant nausea and vomiting   Complete by: As directed    Call MD for:  severe uncontrolled pain   Complete by: As directed    Discharge instructions   Complete by: As directed    Follow-up with your primary care provider in 1 to 2 weeks Needs to follow-up with gynecology for the pelvic/ovarian congestion Arrangement has been made for you to  [facility] Dr. Chales AbrahamsGupta   Increase activity slowly   Complete by: As directed       Allergies as of 10/29/2020   No Known Allergies      Medication List     TAKE these medications    acetaminophen 500 MG tablet Commonly known as: TYLENOL Take 500 mg by mouth every 6 (six) hours as needed for moderate pain or headache.   ibuprofen 200 MG tablet Commonly known as: ADVIL Take 200 mg by mouth every 6 (six) hours as needed for headache or moderate pain.   nitrofurantoin (macrocrystal-monohydrate) 100 MG capsule Commonly known as: MACROBID Take 1 capsule (100 mg total) by mouth 2 (two) times daily.       No Known Allergies    The results of significant diagnostics from this hospitalization (including imaging, microbiology, ancillary and laboratory) are listed below for reference.    Significant Diagnostic Studies: CT ABDOMEN PELVIS W CONTRAST  Result Date: 10/27/2020 CLINICAL DATA:  Left lower quadrant abdominal pain EXAM: CT ABDOMEN AND PELVIS WITH CONTRAST TECHNIQUE: Multidetector CT imaging of the abdomen and pelvis was performed using the standard protocol following bolus administration of intravenous contrast. CONTRAST:  75mL OMNIPAQUE IOHEXOL 350 MG/ML SOLN COMPARISON:  None. FINDINGS: Lower chest: No acute abnormality. Hepatobiliary: No focal liver abnormality is seen. No gallstones, gallbladder wall thickening, or biliary dilatation. Pancreas: There is a 1.1 x 1.9 x 1.5 cm indeterminate low-attenuation lesion within the tail of the pancreas best seen on axial image # 25 and coronal image # 82. The pancreas is otherwise unremarkable. No peripancreatic adenopathy or fluid collections are identified. Spleen: Unremarkable Adrenals/Urinary Tract: Adrenal glands are unremarkable. Kidneys are normal, without renal calculi, focal lesion, or hydronephrosis. Bladder is unremarkable. Stomach/Bowel: Stomach is within  normal limits. Appendix appears normal. No evidence of bowel wall thickening, distention, or inflammatory changes. No free intraperitoneal gas or fluid. 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 The right common, external, and internal iliac veins appear patent. The abdominal vasculature is otherwise unremarkable. No pathologic adenopathy within the abdomen and pelvis. Reproductive: Uterus and bilateral adnexa are unremarkable. Other: No abdominal wall hernia.  Rectum is unremarkable. Musculoskeletal: No acute bone abnormality. Osseous structures are age-appropriate. IMPRESSION: No acute intra-abdominal pathology identified. No definite radiographic explanation for the patient's reported symptoms. 19 mm indeterminate low-attenuation lesion within the tail the pancreas. This would be better assessed with EUS or pancreatic protocol MRI examination. 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 Clinical correlation for signs and symptoms of pelvic venous congestion may be helpful. Interventional radiology consultation may also be helpful for further management. Electronically Signed   By: Helyn NumbersAshesh  Parikh M.D.   On: 10/27/2020 22:35   MR ABDOMEN MRCP W WO CONTAST  Result Date: 10/28/2020 CLINICAL DATA:  CT demonstrating a pancreatic tail indeterminate hypoattenuating lesion. EXAM: MRI ABDOMEN WITHOUT AND WITH CONTRAST (  INCLUDING MRCP) TECHNIQUE: Multiplanar multisequence MR imaging of the abdomen was performed both before and after the administration of intravenous contrast. Heavily T2-weighted images of the biliary and pancreatic ducts were obtained, and three-dimensional MRCP images were rendered by post processing. CONTRAST:  8.1mL GADAVIST GADOBUTROL 1 MMOL/ML IV SOLN COMPARISON:  CT of 1 day prior FINDINGS: Lower chest: Normal heart  size without pericardial or pleural effusion. Hepatobiliary: Mild hepatic steatosis. No focal liver lesion. Normal gallbladder, without biliary ductal dilatation. Pancreas: Corresponding to the CT abnormality, within the pancreatic tail, is a T2 hyperintense, macrolobulated minimally septated lesion which measures maximally 1.7 cm on 16/5 and 16/4. Demonstrates enhancement within a thin septation including on 57/1200 2. No solid nodular components. No main pancreatic duct dilatation or acute inflammation. Spleen:  Normal in size, without focal abnormality. Adrenals/Urinary Tract: Normal adrenal glands. Normal kidneys, without hydronephrosis. Stomach/Bowel: Normal stomach and abdominal bowel loops. Vascular/Lymphatic: Normal caliber of the aorta and branch vessels. No retroperitoneal or retrocrural adenopathy. Other:  No ascites. Musculoskeletal: No acute osseous abnormality. IMPRESSION: 1. Minimally septated pancreatic tail cystic lesion of 1.7 cm. Differential considerations include pseudocyst or indolent cystic neoplasm. Per consensus criteria, clinical strategies include repeat MRI/MRCP at 6 months versus further evaluation with endoscopic ultrasound. This recommendation follows ACR consensus guidelines: Management of Incidental Pancreatic Cysts: A White Paper of the ACR Incidental Findings Committee. J Am Coll Radiol 2017;14:911-923. 2.  No acute abdominal process. 3. Mild hepatic steatosis Electronically Signed   By: Jeronimo Greaves M.D.   On: 10/28/2020 05:30    Microbiology: Recent Results (from the past 240 hour(s))  SARS CORONAVIRUS 2 (TAT 6-24 HRS) Nasopharyngeal Nasopharyngeal Swab     Status: None   Collection Time: 10/28/20  1:09 AM   Specimen: Nasopharyngeal Swab  Result Value Ref Range Status   SARS Coronavirus 2 NEGATIVE NEGATIVE Final    Comment: (NOTE) SARS-CoV-2 target nucleic acids are NOT DETECTED.  The SARS-CoV-2 RNA is generally detectable in upper and lower respiratory specimens  during the acute phase of infection. Negative results do not preclude SARS-CoV-2 infection, do not rule out co-infections with other pathogens, and should not be used as the sole basis for treatment or other patient management decisions. Negative results must be combined with clinical observations, patient history, and epidemiological information. The expected result is Negative.  Fact Sheet for Patients: HairSlick.no  Fact Sheet for Healthcare Providers: quierodirigir.com  This test is not yet approved or cleared by the [place] FDA and  has been authorized for detection and/or diagnosis of SARS-CoV-2 by FDA under an Emergency Use Authorization (EUA). This EUA will remain  in effect (meaning this test can be used) for the duration of the COVID-19 declaration under Se ction 564(b)(1) of the Act, 21 U.S.C. section 360bbb-3(b)(1), unless the authorization is terminated or revoked sooner.  Performed at [facility], [address], [place], [place] [address]   Wet prep, genital     Status: Abnormal   Collection Time: 10/28/20  1:20 AM   Specimen: PATH Cytology Cervicovaginal Ancillary Only  Result Value Ref Range Status   Yeast Wet Prep HPF POC PRESENT (A) NONE SEEN Final   Trich, Wet Prep NONE SEEN NONE SEEN Final   Clue Cells Wet Prep HPF POC NONE SEEN NONE SEEN Final   WBC, Wet Prep HPF POC MANY (A) NONE SEEN Final   Sperm NONE SEEN  Final    Comment: Performed at [facility], [address] [place],  [place] [address]     Labs: Basic Metabolic Panel: Recent Labs  Lab 10/27/20 1835 10/29/20 0050  NA 131* 137  K 3.7 3.6  CL 101 107  CO2 22 25  GLUCOSE 89 118*  BUN 10 16  CREATININE 0.59 0.75  CALCIUM 8.7* 8.9   Liver Function Tests: Recent Labs  Lab 10/27/20 1835  AST 18  ALT 17  ALKPHOS 69  BILITOT 0.7  PROT 7.3  ALBUMIN 4.1   Recent Labs  Lab 10/27/20 1835  LIPASE 41   No  results for input(s): AMMONIA in the last 168 hours. CBC: Recent Labs  Lab 10/27/20 1835 10/29/20 0050  WBC 4.7 4.1  NEUTROABS 2.4  --   HGB 13.4 12.5  HCT 39.9 37.1  MCV 93.4 93.2  PLT 230 201   Cardiac Enzymes: No results for input(s): CKTOTAL, CKMB, CKMBINDEX, TROPONINI in the last 168 hours. BNP: BNP (last 3 results) No results for input(s): BNP in the last 8760 hours.  ProBNP (last 3 results) No results for input(s): PROBNP in the last 8760 hours.  CBG: No results for input(s): GLUCAP in the last 168 hours.     Signed:  Myrtie Neither, MD [facility] 10/29/2020, 3:18 PM

## 2020-10-30 LAB — GC/CHLAMYDIA PROBE AMP (~~LOC~~) NOT AT ARMC
Chlamydia: NEGATIVE
Comment: NEGATIVE
Comment: NORMAL
Neisseria Gonorrhea: NEGATIVE

## 2020-11-01 ENCOUNTER — Ambulatory Visit: Payer: Medicaid Other | Attending: Neurology

## 2020-11-01 ENCOUNTER — Other Ambulatory Visit: Payer: Self-pay

## 2020-11-01 DIAGNOSIS — M6281 Muscle weakness (generalized): Secondary | ICD-10-CM | POA: Insufficient documentation

## 2020-11-01 DIAGNOSIS — M545 Low back pain, unspecified: Secondary | ICD-10-CM | POA: Insufficient documentation

## 2020-11-01 NOTE — Therapy (Signed)
[facility] [address] [place], [place], [address] Phone: [phone]   Fax:  [phone]  Physical Therapy Evaluation  Patient Details  Name: Erin Carney MRN: [id number] Date of Birth: 1985-10-11 Referring Provider (PT): Drema Dallas, DO   Encounter Date: 11/01/2020   PT End of Session - 11/01/20 1716     Visit Number 1    Number of Visits 17    Date for PT Re-Evaluation 12/27/20    Authorization Type MCD - CCME    PT Start Time 1401    PT Stop Time 1445    PT Time Calculation (min) 44 min    Activity Tolerance Patient tolerated treatment well;No increased pain    Behavior During Therapy WFL for tasks assessed/performed             Past Medical History:  Diagnosis Date   Medical history non-contributory     Past Surgical History:  Procedure Laterality Date   NO PAST SURGERIES      There were no vitals filed for this visit.    Subjective Assessment - 11/01/20 1404     Subjective Pt is a 35 y/o F who presents to PT with reports of chronic LBP along with referral down L LE. She also promotes paresthesias down both LEs, L>R. Pt notes that pain has been increasing since last October, but she has had it for a long time. Pt was also recently in [facility] d/t abdominal pain, with discharge on 10/29/20.    Patient is accompained by: Interpreter    Limitations Walking    How long can you sit comfortably? 3-5 hours    How long can you stand comfortably? 3 hrs    How long can you walk comfortably? 1hr    Patient Stated Goals Pt would like to be able to play with children without her back bothering her    Currently in Pain? Yes    Pain Score 3    10/10 at worst in last two weeks   Pain Location Back    Pain Orientation Lower    Pain Descriptors / Indicators Sharp    Pain Type Chronic pain    Pain Onset More than a month ago    Pain Frequency Intermittent    Aggravating Factors  exercises,    Pain  Relieving Factors medication                [facility] PT Assessment - 11/01/20 0001       Assessment   Medical Diagnosis M54.32 (ICD-10-CM) - Sciatica of left side    Referring Provider (PT) Everlena Cooper, Adam R, DO    Hand Dominance Right      Precautions   Precautions None      Restrictions   Weight Bearing Restrictions No      Balance Screen   Has the patient fallen in the past 6 months No    Has the patient had a decrease in activity level because of a fear of falling?  No    Is the patient reluctant to leave their home because of a fear of falling?  No      Home Nurse, mental health Private residence    Living Arrangements Spouse/significant other;Children    Type of Home House    Home Access Stairs to enter    Entrance Stairs-Number of Steps 3    Entrance Stairs-Rails Can reach both    Home Layout One level  Prior Function   Level of Independence Independent;Independent with basic ADLs    Vocation Unemployed      Cognition   Overall Cognitive Status Within Functional Limits for tasks assessed    Attention Focused      Observation/Other Assessments   Focus on Therapeutic Outcomes (FOTO)  no FOTO; MCD    Other Surveys  Modified Oswestry    Modified Oswestry 72%      Sensation   Light Touch Appears Intact      Palpation   Spinal mobility hypomobile L3-5    Palpation comment TTP to lumbar paraspinals      Special Tests    Special Tests Lumbar    Other special tests SLR Postive L    Lumbar Tests Slump Test      Slump test   Findings Negative      Transfers   Five time sit to stand comments  15 seconds; 3/10 LBP                        Objective measurements completed on examination: See above findings.               PT Education - 11/01/20 1454     Education Details eval findings, HEP, POC    Person(s) Educated Patient    Methods Explanation;Demonstration;Handout    Comprehension Verbalized  understanding;Returned demonstration              PT Short Term Goals - 11/01/20 1724       PT SHORT TERM GOAL #1   Title Pt will be compliant and knowledgeable with 90% of initial HEP in order to improve comfort and carryover    Baseline initial HEP given    Time 3    Period Weeks    Status New    Target Date 11/22/20      PT SHORT TERM GOAL #2   Title Pt will improve 5xSTS time to no greater than 10 seconds in order to demonstrate improved balance and functional mobility    Baseline 15 seconds    Time 3    Period Weeks    Status New    Target Date 11/22/20               PT Long Term Goals - 11/01/20 1725       PT LONG TERM GOAL #1   Title Pt will improve ODI score to no greater than 50% disability as proxy for functional improvement    Baseline 72% disability    Time 8    Period Weeks    Status New    Target Date 12/27/20      PT LONG TERM GOAL #2   Title Pt will self report LBP no greater than 3/10 at worst in order to improve comfort and function    Baseline 10/10 at worst    Time 8    Period Weeks    Status New    Target Date 12/27/20      PT LONG TERM GOAL #3   Title Pt will be able to squat and lift 25lb with no increase in pain in order to simulate lifting young children with improved tolerance    Baseline unable    Time 8    Period Weeks    Status New    Target Date 12/27/20  Plan - 11/01/20 1727     Clinical Impression Statement Pt is a pleasant 35 y/o F who presents to PT with reports of chronic LBP and discomfort. Physical findings are consistent with physician impression, as pt demonstrates decreased functional mobility and lumbar hypomobility. During evaluation she demonstrates slight preference for lumbar ext, as fwd flexion peripheralizes symptoms down L LE. Her ODI score indicates severe disability and shows she is operating well below baseline PLOF. PT would benefit from skilled PT services working on  improving core endurance and proximal hip muscle strength. Will assess response to initial HEP and progress as able.    Personal Factors and Comorbidities Time since onset of injury/illness/exacerbation    Examination-Activity Limitations Squat;Stairs;Lift;Dressing;Carry;Bend    Examination-Participation Restrictions Yard Work;Volunteer;Community Activity    Clinical Decision Making Low    Rehab Potential Good    PT Frequency 1x / week    PT Duration 3 weeks    PT Treatment/Interventions ADLs/Self Care Home Management;Electrical Stimulation;Cryotherapy;Moist Heat;Aquatic Therapy;Ultrasound;Gait training;Stair training;Functional mobility training;Therapeutic activities;Therapeutic exercise;Balance training;Neuromuscular re-education;Patient/family education;Manual techniques;Dry needling;Taping;Vasopneumatic Device    PT Next Visit Plan assess response to HEP, extension-based exercises?; progress as able    PT Home Exercise Plan [email or web address]    Consulted and Agree with Plan of Care Patient             Patient will benefit from skilled therapeutic intervention in order to improve the following deficits and impairments:  Decreased activity tolerance, Decreased balance, Decreased strength, Decreased mobility, Decreased endurance, Decreased range of motion, Hypomobility, Impaired sensation, Pain  Visit Diagnosis: Midline low back pain, unspecified chronicity, unspecified whether sciatica present - Plan: PT plan of care cert/re-cert  Muscle weakness (generalized) - Plan: PT plan of care cert/re-cert     Problem List Patient Active Problem List   Diagnosis Date Noted   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 [place], [place], [address] Phone: [phone]   Fax:  [phone]  Name: Maigen Mozingo MRN: [id number] Date of Birth: [birth year]

## 2020-11-06 ENCOUNTER — Encounter: Payer: Self-pay | Admitting: Gastroenterology

## 2020-11-06 ENCOUNTER — Ambulatory Visit: Payer: Medicaid Other | Admitting: Physician Assistant

## 2020-11-06 ENCOUNTER — Other Ambulatory Visit: Payer: Self-pay

## 2020-11-06 ENCOUNTER — Ambulatory Visit (INDEPENDENT_AMBULATORY_CARE_PROVIDER_SITE_OTHER): Payer: Medicaid Other | Admitting: Gastroenterology

## 2020-11-06 VITALS — BP 110/72 | HR 90 | Ht 64.0 in | Wt 187.1 lb

## 2020-11-06 DIAGNOSIS — K862 Cyst of pancreas: Secondary | ICD-10-CM

## 2020-11-06 DIAGNOSIS — K869 Disease of pancreas, unspecified: Secondary | ICD-10-CM

## 2020-11-06 NOTE — Patient Instructions (Addendum)
If you are age 37 or older, your body mass index should be between 23-30. Your Body mass index is 32.12 kg/m. If this is out of the aforementioned range listed, please consider follow up with your Primary Care Provider.  If you are age 56 or younger, your body mass index should be between 19-25. Your Body mass index is 32.12 kg/m. If this is out of the aformentioned range listed, please consider follow up with your Primary Care Provider.   __________________________________________________________  The [facility] providers would like to encourage you to use [facility] to communicate with providers for non-urgent requests or questions.  Due to long hold times on the telephone, sending your provider a message by [facility] may be a faster and more efficient way to get a response.  Please allow 48 business hours for a response.  Please remember that this is for non-urgent requests.   Please call in 6 months to [phone] schedule an MRI/MRCP at [facility] and get lab work in [place] and make follow up appointment.   You have been scheduled for an MRI at           on           . Your appointment time is       . Please arrive to admitting (at main entrance of the hospital) 30 minutes prior to your appointment time for registration purposes. Please make certain not to have anything to eat or drink 6 hours prior to your test. In addition, if you have any metal in your body, have a pacemaker or defibrillator, please be sure to let your ordering physician know. This test typically takes 45 minutes to 1 hour to complete. Should you need to reschedule, please call [phone] to do so.   Please call with any questions.  Thank you,  Dr. Lynann Bologna

## 2020-11-06 NOTE — Progress Notes (Signed)
Chief Complaint:   Referring Provider:  Department, [place]      ASSESSMENT AND PLAN;   #1. Incidental asymptomatic TOP (tail of pancreas) 1.9 cm cystic lesion. No red flag signs or symptoms. No H/O pancreatitis, trauma. Nl PD, no intramural nodules. Nl CA 19-9 10/2020   Plan: -MRI with MRCP x 6 months -Check CBC, CMP, lipase, CA19-9 at time of MRI in 6 mts -FU after MRI/MRCP.  If any change or enlarging cyst, would consider EUS. -Discussed in detail with the patient through the interpreter.   HPI:    Erin Carney is a 35 y.o. female   For incidental finding-cystic lesion in the tail of the pancreas on CT looking followed by MRI.  No nausea, vomiting, heartburn, regurgitation, odynophagia or dysphagia.  No significant diarrhea or constipation.  No melena or hematochezia. No unintentional weight loss.   No Jaundice, dark urine or pale stools.  No history of alcohol use.  No history of pancreatitis.  Occ LLQ pain and R back/hip x many yrs, worst menstrual cycles.  She has appointment with GYN.    Previous GI work-up:  CT AP 10/2020 -No acute intra-abdominal pathology identified. No definite radiographic explanation for the patient's reported symptoms.   -19 mm indeterminate low-attenuation lesion within the tail the pancreas. This would be better assessed with EUS or pancreatic protocol MRI examination.   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 Clinical correlation for signs and symptoms of pelvic venous congestion may be helpful. Interventional radiology consultation may also be helpful for further management.  MRI 10/2020 1. Minimally septated pancreatic tail cystic lesion of 1.7 cm. Differential considerations include pseudocyst or indolent cystic neoplasm. Per consensus criteria, clinical strategies include repeat MRI/MRCP at 6 months versus further  evaluation with endoscopic ultrasound. This recommendation follows ACR consensus guidelines: Management of Incidental Pancreatic Cysts: A White Paper of the ACR Incidental Findings Committee. J Am Coll Radiol 2017;14:911-923. 2.  No acute abdominal process. 3. Mild hepatic steatosis    Past Medical History:  Diagnosis Date   Medical history non-contributory     Past Surgical History:  Procedure Laterality Date   NO PAST SURGERIES      Family History  Problem Relation Age of Onset   Alcohol abuse Neg Hx    Arthritis Neg Hx    Asthma Neg Hx    Birth defects Neg Hx    Cancer Neg Hx    COPD Neg Hx    Depression Neg Hx    Diabetes Neg Hx    Drug abuse Neg Hx    Early death Neg Hx    Hearing loss Neg Hx    Heart disease Neg Hx    Hyperlipidemia Neg Hx    Hypertension Neg Hx    Kidney disease Neg Hx    Learning disabilities Neg Hx    Mental illness Neg Hx    Mental retardation Neg Hx    Miscarriages / Stillbirths Neg Hx    Stroke Neg Hx    Vision loss Neg Hx    Varicose Veins Neg Hx    Pancreatic cancer Neg Hx    Colon cancer Neg Hx     Social History   Tobacco Use   Smoking status: Never   Smokeless tobacco: Never  Vaping Use   Vaping Use: Never used  Substance Use Topics   Alcohol use: No   Drug use:  No    Current Outpatient Medications  Medication Sig Dispense Refill   acetaminophen (TYLENOL) 500 MG tablet Take 500 mg by mouth every 6 (six) hours as needed for moderate pain or headache.     ibuprofen (ADVIL) 200 MG tablet Take 200 mg by mouth every 6 (six) hours as needed for headache or moderate pain.     No current facility-administered medications for this visit.    No Known Allergies  Review of Systems:  Constitutional: Denies fever, chills, diaphoresis, appetite change and fatigue.  HEENT: Denies photophobia, eye pain, redness, hearing loss, ear pain, congestion, sore throat, rhinorrhea, sneezing, mouth sores, neck pain, neck stiffness and  tinnitus.   Respiratory: Denies SOB, DOE, cough, chest tightness,  and wheezing.   Cardiovascular: Denies chest pain, palpitations and leg swelling.  Genitourinary: Denies dysuria, urgency, frequency, hematuria, flank pain and difficulty urinating.  Musculoskeletal: Denies myalgias, back pain, joint swelling, arthralgias and gait problem.  Skin: No rash.  Neurological: Denies dizziness, seizures, syncope, weakness, light-headedness, numbness and headaches.  Hematological: Denies adenopathy. Easy bruising, personal or family bleeding history  Psychiatric/Behavioral: No anxiety or depression     Physical Exam:    BP 110/72   Pulse 90   Ht 5\' 4"  (1.626 m)   Wt 187 lb 2 oz (84.9 kg)   SpO2 98%   BMI 32.12 kg/m  Wt Readings from Last 3 Encounters:  11/06/20 187 lb 2 oz (84.9 kg)  10/27/20 186 lb 8.2 oz (84.6 kg)  08/08/20 186 lb 6.4 oz (84.6 kg)   Constitutional:  Well-developed, in no acute distress. Psychiatric: Normal mood and affect. Behavior is normal. HEENT: Pupils normal.  Conjunctivae are normal. No scleral icterus. Neck supple.  Cardiovascular: Normal rate, regular rhythm. No edema Pulmonary/chest: Effort normal and breath sounds normal. No wheezing, rales or rhonchi. Abdominal: Soft, nondistended. Nontender. Bowel sounds active throughout. There are no masses palpable. No hepatomegaly. Rectal: Deferred Neurological: Alert and oriented to person place and time. Skin: Skin is warm and dry. No rashes noted.  Data Reviewed: I have personally reviewed following labs and imaging studies  CBC: CBC Latest Ref Rng & Units 10/29/2020 10/27/2020 04/28/2017  WBC 4.0 - 10.5 K/uL 4.1 4.7 8.4  Hemoglobin 12.0 - 15.0 g/dL 16.112.5 09.613.4 10.6(L)  Hematocrit 36.0 - 46.0 % 37.1 39.9 30.9(L)  Platelets 150 - 400 K/uL 201 230 223    CMP: CMP Latest Ref Rng & Units 10/29/2020 10/27/2020  Glucose 70 - 99 mg/dL 045(W118(H) 89  BUN 6 - 20 mg/dL 16 10  Creatinine 0.980.44 - 1.00 mg/dL 1.190.75 1.470.59  Sodium  829135 - 145 mmol/L 137 131(L)  Potassium 3.5 - 5.1 mmol/L 3.6 3.7  Chloride 98 - 111 mmol/L 107 101  CO2 22 - 32 mmol/L 25 22  Calcium 8.9 - 10.3 mg/dL 8.9 5.6(O8.7(L)  Total Protein 6.5 - 8.1 g/dL - 7.3  Total Bilirubin 0.3 - 1.2 mg/dL - 0.7  Alkaline Phos 38 - 126 U/L - 69  AST 15 - 41 U/L - 18  ALT 0 - 44 U/L - 17     Recent Results (from the past 240 hour(s))  SARS CORONAVIRUS 2 (TAT 6-24 HRS) Nasopharyngeal Nasopharyngeal Swab     Status: None   Collection Time: 10/28/20  1:09 AM   Specimen: Nasopharyngeal Swab  Result Value Ref Range Status   SARS Coronavirus 2 NEGATIVE NEGATIVE Final    Comment: (NOTE) SARS-CoV-2 target nucleic acids are NOT DETECTED.  The SARS-CoV-2 RNA is generally  detectable in upper and lower respiratory specimens during the acute phase of infection. Negative results do not preclude SARS-CoV-2 infection, do not rule out co-infections with other pathogens, and should not be used as the sole basis for treatment or other patient management decisions. Negative results must be combined with clinical observations, patient history, and epidemiological information. The expected result is Negative.  Fact Sheet for Patients: HairSlick.no  Fact Sheet for Healthcare Providers: quierodirigir.com  This test is not yet approved or cleared by the [place] FDA and  has been authorized for detection and/or diagnosis of SARS-CoV-2 by FDA under an Emergency Use Authorization (EUA). This EUA will remain  in effect (meaning this test can be used) for the duration of the COVID-19 declaration under Se ction 564(b)(1) of the Act, 21 U.S.C. section 360bbb-3(b)(1), unless the authorization is terminated or revoked sooner.  Performed at [facility], [address] [place], [place] [address]   Wet prep, genital     Status: Abnormal   Collection Time: 10/28/20  1:20 AM   Specimen: PATH Cytology Cervicovaginal  Ancillary Only  Result Value Ref Range Status   Yeast Wet Prep HPF POC PRESENT (A) NONE SEEN Final   Trich, Wet Prep NONE SEEN NONE SEEN Final   Clue Cells Wet Prep HPF POC NONE SEEN NONE SEEN Final   WBC, Wet Prep HPF POC MANY (A) NONE SEEN Final   Sperm NONE SEEN  Final    Comment: Performed at [facility], [address], [place], [place] [address]      Radiology Studies: CT ABDOMEN PELVIS W CONTRAST  Result Date: 10/27/2020 CLINICAL DATA:  Left lower quadrant abdominal pain EXAM: CT ABDOMEN AND PELVIS WITH CONTRAST TECHNIQUE: Multidetector CT imaging of the abdomen and pelvis was performed using the standard protocol following bolus administration of intravenous contrast. CONTRAST:  61mL OMNIPAQUE IOHEXOL 350 MG/ML SOLN COMPARISON:  None. FINDINGS: Lower chest: No acute abnormality. Hepatobiliary: No focal liver abnormality is seen. No gallstones, gallbladder wall thickening, or biliary dilatation. Pancreas: There is a 1.1 x 1.9 x 1.5 cm indeterminate low-attenuation lesion within the tail of the pancreas best seen on axial image # 25 and coronal image # 82. The pancreas is otherwise unremarkable. No peripancreatic adenopathy or fluid collections are identified. Spleen: Unremarkable Adrenals/Urinary Tract: Adrenal glands are unremarkable. Kidneys are normal, without renal calculi, focal lesion, or hydronephrosis. Bladder is unremarkable. Stomach/Bowel: Stomach is within normal limits. Appendix appears normal. No evidence of bowel wall thickening, distention, or inflammatory changes. No free intraperitoneal gas or fluid. 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 The right common, external, and internal iliac veins appear patent. The abdominal vasculature is otherwise unremarkable. No pathologic adenopathy within the abdomen and pelvis. Reproductive: Uterus and bilateral adnexa are unremarkable.  Other: No abdominal wall hernia.  Rectum is unremarkable. Musculoskeletal: No acute bone abnormality. Osseous structures are age-appropriate. IMPRESSION: No acute intra-abdominal pathology identified. No definite radiographic explanation for the patient's reported symptoms. 19 mm indeterminate low-attenuation lesion within the tail the pancreas. This would be better assessed with EUS or pancreatic protocol MRI examination. 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 Clinical correlation for signs and symptoms of pelvic venous congestion may be helpful. Interventional radiology consultation may also be helpful for further management. Electronically Signed   By: Lyda Kalata.D.  On: 10/27/2020 22:35   MR ABDOMEN MRCP W WO CONTAST  Result Date: 10/28/2020 CLINICAL DATA:  CT demonstrating a pancreatic tail indeterminate hypoattenuating lesion. EXAM: MRI ABDOMEN WITHOUT AND WITH CONTRAST (INCLUDING MRCP) TECHNIQUE: Multiplanar multisequence MR imaging of the abdomen was performed both before and after the administration of intravenous contrast. Heavily T2-weighted images of the biliary and pancreatic ducts were obtained, and three-dimensional MRCP images were rendered by post processing. CONTRAST:  8.26mL GADAVIST GADOBUTROL 1 MMOL/ML IV SOLN COMPARISON:  CT of 1 day prior FINDINGS: Lower chest: Normal heart size without pericardial or pleural effusion. Hepatobiliary: Mild hepatic steatosis. No focal liver lesion. Normal gallbladder, without biliary ductal dilatation. Pancreas: Corresponding to the CT abnormality, within the pancreatic tail, is a T2 hyperintense, macrolobulated minimally septated lesion which measures maximally 1.7 cm on 16/5 and 16/4. Demonstrates enhancement within a thin septation including on 57/1200 2. No solid nodular components. No main pancreatic duct dilatation or acute inflammation. Spleen:  Normal  in size, without focal abnormality. Adrenals/Urinary Tract: Normal adrenal glands. Normal kidneys, without hydronephrosis. Stomach/Bowel: Normal stomach and abdominal bowel loops. Vascular/Lymphatic: Normal caliber of the aorta and branch vessels. No retroperitoneal or retrocrural adenopathy. Other:  No ascites. Musculoskeletal: No acute osseous abnormality. IMPRESSION: 1. Minimally septated pancreatic tail cystic lesion of 1.7 cm. Differential considerations include pseudocyst or indolent cystic neoplasm. Per consensus criteria, clinical strategies include repeat MRI/MRCP at 6 months versus further evaluation with endoscopic ultrasound. This recommendation follows ACR consensus guidelines: Management of Incidental Pancreatic Cysts: A White Paper of the ACR Incidental Findings Committee. J Am Coll Radiol 2017;14:911-923. 2.  No acute abdominal process. 3. Mild hepatic steatosis Electronically Signed   By: Jeronimo Greaves M.D.   On: 10/28/2020 05:30      CT and MRI was independently reviewed.   Edman Circle, MD 11/06/2020, 2:02 PM  Cc: Department, [place]*

## 2020-11-10 ENCOUNTER — Ambulatory Visit: Payer: Medicaid Other | Attending: Neurology | Admitting: Physical Therapy

## 2020-11-10 ENCOUNTER — Encounter: Payer: Self-pay | Admitting: Physical Therapy

## 2020-11-10 ENCOUNTER — Other Ambulatory Visit: Payer: Self-pay

## 2020-11-10 DIAGNOSIS — M6281 Muscle weakness (generalized): Secondary | ICD-10-CM | POA: Diagnosis present

## 2020-11-10 DIAGNOSIS — M545 Low back pain, unspecified: Secondary | ICD-10-CM | POA: Diagnosis not present

## 2020-11-10 NOTE — Therapy (Addendum)
[facility] [address] [place], [place], [address] Phone: [phone]   Fax:  [phone]  Physical Therapy Treatment/Discharge  Patient Details  Name: Erin Carney MRN: [id number] Date of Birth: [birth year] Referring Provider (PT): Drema Dallas, DO   Encounter Date: 11/10/2020   PT End of Session - 11/10/20 1021     Visit Number 2    Number of Visits 17    Date for PT Re-Evaluation 12/27/20    Authorization Type MCD - CCME    Authorization Time Period 11/10/20-11/30/20    Authorization - Visit Number 1    Authorization - Number of Visits 3    PT Start Time 1018    PT Stop Time 1056    PT Time Calculation (min) 38 min             Past Medical History:  Diagnosis Date   Medical history non-contributory     Past Surgical History:  Procedure Laterality Date   NO PAST SURGERIES      There were no vitals filed for this visit.   Subjective Assessment - 11/10/20 1022     Subjective Sometimes by legs go numb when I sit.    Currently in Pain? Yes    Pain Score 4     Pain Location Back    Pain Orientation Lower;Left    Pain Descriptors / Indicators Stabbing;Constant    Pain Type Chronic pain    Aggravating Factors  sitting up straight , standing up straight    Pain Relieving Factors slouch, weight shift off of that side                               [facility] Adult PT Treatment/Exercise - 11/10/20 0001       Lumbar Exercises: Stretches   Standing Extension 5 reps;10 seconds    Prone on Elbows Stretch 1 rep    Press Ups 5 reps    Press Ups Limitations 10 seconds    Figure 4 Stretch Limitations push and pull on left    Other Lumbar Stretch Exercise cat/camel  to childs pose x 10      Lumbar Exercises: Supine   Bridge 20 reps    Bridge Limitations increased pain, tolerates small ROM      Lumbar Exercises: Prone   Single Arm Raise 10 reps    Straight Leg Raise 10 reps   2 sets one  with pillow under abdominals   Straight Leg Raises Limitations added 1 pillow    Other Prone Lumbar Exercises prone over pillow donkey kicks      Manual Therapy   Manual therapy comments LAD - increased back pain so discontinued                      PT Short Term Goals - 11/01/20 1724       PT SHORT TERM GOAL #1   Title Pt will be compliant and knowledgeable with 90% of initial HEP in order to improve comfort and carryover    Baseline initial HEP given    Time 3    Period Weeks    Status New    Target Date 11/22/20      PT SHORT TERM GOAL #2   Title Pt will improve 5xSTS time to no greater than 10 seconds in order to demonstrate improved balance and functional mobility    Baseline 15  seconds    Time 3    Period Weeks    Status New    Target Date 11/22/20               PT Long Term Goals - 11/01/20 1725       PT LONG TERM GOAL #1   Title Pt will improve ODI score to no greater than 50% disability as proxy for functional improvement    Baseline 72% disability    Time 8    Period Weeks    Status New    Target Date 12/27/20      PT LONG TERM GOAL #2   Title Pt will self report LBP no greater than 3/10 at worst in order to improve comfort and function    Baseline 10/10 at worst    Time 8    Period Weeks    Status New    Target Date 12/27/20      PT LONG TERM GOAL #3   Title Pt will be able to squat and lift 25lb with no increase in pain in order to simulate lifting young children with improved tolerance    Baseline unable    Time 8    Period Weeks    Status New    Target Date 12/27/20                   Plan - 11/10/20 1046     Clinical Impression Statement Pt reports compliance with HEP and some decreased pain with prone on elbows. She notes popping in right hip with walking and carrying 15lb items. She reports increased pain upright sitting. Able to progress with extension biased including prone pressups and prone hip extensions over  pillow. Afterward she reported a little decreased pain. She was given an updated HEP and cautioned to discontinue if she has increased pain.    PT Next Visit Plan assess response to HEP, extension-based exercises?; progress as able    PT Home Exercise Plan [id number]             Patient will benefit from skilled therapeutic intervention in order to improve the following deficits and impairments:  Decreased activity tolerance, Decreased balance, Decreased strength, Decreased mobility, Decreased endurance, Decreased range of motion, Hypomobility, Impaired sensation, Pain  Visit Diagnosis: Midline low back pain, unspecified chronicity, unspecified whether sciatica present  Muscle weakness (generalized)     Problem List Patient Active Problem List   Diagnosis Date Noted   Mass of pancreas 10/28/2020   LLQ abdominal pain 10/28/2020   Preterm labor with preterm delivery second trimester 04/29/2017   Chorioamnionitis in second trimester 04/29/2017   Premature rupture of membranes 04/25/2017   Preterm premature rupture of membranes (PPROM) delivered, current hospitalization 02/24/2015   Second-degree perineal laceration, with delivery 02/22/2015    Sherrie Mustache, PTA 11/10/2020, 11:00 AM  [facility] [address] [place], [place], [address] Phone: [phone]   Fax:  [phone]  Name: Erin Carney MRN: [id number] Date of Birth: February 23, 1986  PHYSICAL THERAPY DISCHARGE SUMMARY  Visits from Start of Care: 2  Current functional level related to goals / functional outcomes: Unable to assess   Remaining deficits: Unable to assess   Education / Equipment: Unable   Patient agrees to discharge. Patient goals were  unable to assess . Patient is being discharged due to not returning since the last visit.

## 2020-11-15 ENCOUNTER — Ambulatory Visit: Payer: Medicaid Other

## 2020-11-22 ENCOUNTER — Ambulatory Visit: Payer: Medicaid Other

## 2021-02-08 NOTE — Progress Notes (Signed)
NEUROLOGY FOLLOW UP OFFICE NOTE  Erin Carney [id number]  Assessment/Plan:   Chronic migraine without aura, without status migrainosus, not intractable Vertigo Left sided sciatica  Migraine prevention:  start nortriptyline 10mg  at bedtime (also may help with sciatica).  We can increase to 25mg  at bedtime in 4 weeks if needed Migraine rescue:  advised her to stop ibuprofen and Tylenol and try sumatriptan 100mg  (Zofran for nausea) Limit use of pain relievers to no more than 2 days out of week to prevent risk of rebound or medication-overuse headache. Keep headache diary Follow up 7-8 months.   Subjective:  Erin Carney is a 35 year old right-handed female who follows up for migraines and vertigo.  UPDATE: Prescribed topiramate in May but stopped after 3 days because it made her feel worse.  She never started the sumatriptan.   Intensity:  mild or severe Duration:  depends on stress, up to 5 hours.Treats with Tylenol or Advil Frequency:  2 to 3 times a week.  She went to PT for sciatica, which helped.  But she had to stop prematurely because she had to take her children to appointments.    She saw ENT for hearing loss and dizziness.  I do not have those notes.  She said they performed cerumen disimpaction.  She didn't have any sensorineural hearing loss.    Current medications:  ibuprofen, Tylenol, Zofran 4mg    HISTORY: Symptoms started in October 2021.  Dizziness occurs separately or with headache.  Headaches are mild to severe bitemporal pressure with nausea, phonophobia and sometimes sees lights but no vomiting, photophobia, numbness or weakness.  They last all day and may occur 2-3 days a week or up to the entire week.  She averages about 15 headache days a month.  Bending over causes increased head pressure.  Dizziness is described as a spinning sensation.  It occurs when sitting still and is aggravated with head movements.  It occurs twice a day and lasts up to 5  minutes.  She also reports itching in her left ear.  She also notes some reduced hearing loss bilaterally but worse in the left ear.  Epley maneuver helpful but only temporarily.  CT head on 06/02/2020 personally reviewed was normal.     She also sometimes has left sciatica that is aggravated when she lifts her children.    Past medications:  Promethazine (made her feel "bad"), topiramate (made her feel "worse"), sumatriptan 100mg , Meclizine (makes her sleepy),   PAST MEDICAL HISTORY: Past Medical History:  Diagnosis Date   Medical history non-contributory     MEDICATIONS: Current Outpatient Medications on File Prior to Visit  Medication Sig Dispense Refill   acetaminophen (TYLENOL) 500 MG tablet Take 500 mg by mouth every 6 (six) hours as needed for moderate pain or headache.     ibuprofen (ADVIL) 200 MG tablet Take 200 mg by mouth every 6 (six) hours as needed for headache or moderate pain.     No current facility-administered medications on file prior to visit.    ALLERGIES: No Known Allergies  FAMILY HISTORY: Family History  Problem Relation Age of Onset   Alcohol abuse Neg Hx    Arthritis Neg Hx    Asthma Neg Hx    Birth defects Neg Hx    Cancer Neg Hx    COPD Neg Hx    Depression Neg Hx    Diabetes Neg Hx    Drug abuse Neg Hx    Early death Neg  Hx    Hearing loss Neg Hx    Heart disease Neg Hx    Hyperlipidemia Neg Hx    Hypertension Neg Hx    Kidney disease Neg Hx    Learning disabilities Neg Hx    Mental illness Neg Hx    Mental retardation Neg Hx    Miscarriages / Stillbirths Neg Hx    Stroke Neg Hx    Vision loss Neg Hx    Varicose Veins Neg Hx    Pancreatic cancer Neg Hx    Colon cancer Neg Hx       Objective:  Blood pressure 93/62, pulse 68, height 5\' 1"  (1.549 m), weight 184 lb 3.2 oz (83.6 kg), SpO2 97 %, unknown if currently breastfeeding. General: No acute distress.  Patient well-groomed.      , DO

## 2021-02-09 ENCOUNTER — Ambulatory Visit: Payer: Medicaid Other | Admitting: Neurology

## 2021-02-09 ENCOUNTER — Encounter: Payer: Self-pay | Admitting: Neurology

## 2021-02-09 ENCOUNTER — Other Ambulatory Visit: Payer: Self-pay

## 2021-02-09 VITALS — BP 93/62 | HR 68 | Ht 61.0 in | Wt 184.2 lb

## 2021-02-09 DIAGNOSIS — G43109 Migraine with aura, not intractable, without status migrainosus: Secondary | ICD-10-CM

## 2021-02-09 DIAGNOSIS — R42 Dizziness and giddiness: Secondary | ICD-10-CM

## 2021-02-09 DIAGNOSIS — M5432 Sciatica, left side: Secondary | ICD-10-CM

## 2021-02-09 MED ORDER — SUMATRIPTAN SUCCINATE 100 MG PO TABS: 100.0000 mg | ORAL_TABLET | ORAL | 5 refills | Status: DC | PRN

## 2021-02-09 MED ORDER — NORTRIPTYLINE HCL 10 MG PO CAPS: 10.0000 mg | ORAL_CAPSULE | Freq: Every day | ORAL | 5 refills | Status: DC

## 2021-02-09 NOTE — Patient Instructions (Signed)
Start nortriptyline 10mg  at bedtime.  Contact me in 4 weeks with update and we can increase dose if needed. Take sumatriptan 100mg  at earliest onset of headache.  May repeat in 2 hours.  Maximum 2 tablets in 24 hours.  Limit use to no more than 2 days out of week to prevent risk of rebound or medication-overuse headache. Take ondansetron for nausea Stop ibuprofen and Tylenol Keep headache diary Follow up 7-8 months.     1. Comience con 10 mg de nortriptilina a la hora de acostarse. Contcteme en 4 semanas con la actualizacin y podemos aumentar la dosis si es necesario. 2. Tome 100 mg de sumatriptn al inicio ms temprano del dolor de . Puede repetirse en 2 horas. Mximo 2 comprimidos en 24 horas. Limite el uso a no ms de 2 das a la semana para riesgo de rebote o dolor de cabeza por uso excesivo de medicamentos. 3. Toma ondansetrn para las nuseas 4. Deja de ibuprofeno y Tylenol 5. Mantenga un diario de dolor de cabeza 6. Seguimiento 7-8 meses.

## 2021-06-19 ENCOUNTER — Other Ambulatory Visit (INDEPENDENT_AMBULATORY_CARE_PROVIDER_SITE_OTHER): Payer: Medicaid Other

## 2021-06-19 ENCOUNTER — Ambulatory Visit (INDEPENDENT_AMBULATORY_CARE_PROVIDER_SITE_OTHER): Payer: Medicaid Other | Admitting: Gastroenterology

## 2021-06-19 ENCOUNTER — Encounter: Payer: Self-pay | Admitting: Gastroenterology

## 2021-06-19 VITALS — BP 102/78 | HR 55 | Ht 61.0 in | Wt 181.1 lb

## 2021-06-19 DIAGNOSIS — K869 Disease of pancreas, unspecified: Secondary | ICD-10-CM

## 2021-06-19 DIAGNOSIS — R1013 Epigastric pain: Secondary | ICD-10-CM

## 2021-06-19 DIAGNOSIS — K219 Gastro-esophageal reflux disease without esophagitis: Secondary | ICD-10-CM

## 2021-06-19 LAB — COMPREHENSIVE METABOLIC PANEL
ALT: 12 U/L (ref 0–35)
AST: 12 U/L (ref 0–37)
Albumin: 4.3 g/dL (ref 3.5–5.2)
Alkaline Phosphatase: 74 U/L (ref 39–117)
BUN: 10 mg/dL (ref 6–23)
CO2: 27 mEq/L (ref 19–32)
Calcium: 8.8 mg/dL (ref 8.4–10.5)
Chloride: 106 mEq/L (ref 96–112)
Creatinine, Ser: 0.57 mg/dL (ref 0.40–1.20)
GFR: 115.63 mL/min (ref >60.00)
Glucose, Bld: 100 mg/dL — ABNORMAL HIGH (ref 70–99)
Potassium: 4.3 mEq/L (ref 3.5–5.1)
Sodium: 138 mEq/L (ref 135–145)
Total Bilirubin: 0.6 mg/dL (ref 0.2–1.2)
Total Protein: 6.8 g/dL (ref 6.0–8.3)

## 2021-06-19 LAB — CBC WITH DIFFERENTIAL/PLATELET
Basophils Absolute: 0 10*3/uL (ref 0.0–0.1)
Basophils Relative: 0.6 % (ref 0.0–3.0)
Eosinophils Absolute: 0 10*3/uL (ref 0.0–0.7)
Eosinophils Relative: 1 % (ref 0.0–5.0)
HCT: 37.8 % (ref 36.0–46.0)
Hemoglobin: 12.9 g/dL (ref 12.0–15.0)
Lymphocytes Relative: 24.8 % (ref 12.0–46.0)
Lymphs Abs: 0.9 10*3/uL (ref 0.7–4.0)
MCHC: 34 g/dL (ref 30.0–36.0)
MCV: 93.8 fl (ref 78.0–100.0)
Monocytes Absolute: 0.3 10*3/uL (ref 0.1–1.0)
Monocytes Relative: 7.5 % (ref 3.0–12.0)
Neutro Abs: 2.5 10*3/uL (ref 1.4–7.7)
Neutrophils Relative %: 66.1 % (ref 43.0–77.0)
Platelets: 193 10*3/uL (ref 150.0–400.0)
RBC: 4.04 Mil/uL (ref 3.87–5.11)
RDW: 13 % (ref 11.5–15.5)
WBC: 3.7 10*3/uL — ABNORMAL LOW (ref 4.0–10.5)

## 2021-06-19 LAB — LIPASE: Lipase: 21 U/L (ref 11.0–59.0)

## 2021-06-19 MED ORDER — OMEPRAZOLE 20 MG PO CPDR: 20.0000 mg | DELAYED_RELEASE_CAPSULE | Freq: Every day | ORAL | 4 refills | Status: DC

## 2021-06-19 NOTE — Patient Instructions (Signed)
If you are age 38 or older, your body mass index should be between 23-30. Your Body mass index is 34.22 kg/m?Marland Kitchen If this is out of the aforementioned range listed, please consider follow up with your Primary Care Provider. ? ?If you are age 76 or younger, your body mass index should be between 19-25. Your Body mass index is 34.22 kg/m?Marland Kitchen If this is out of the aformentioned range listed, please consider follow up with your Primary Care Provider.  ? ?________________________________________________________ ? ?The [facility] providers would like to encourage you to use [facility] to communicate with providers for non-urgent requests or questions.  Due to long hold times on the telephone, sending your provider a message by [facility] may be a faster and more efficient way to get a response.  Please allow 48 business hours for a response.  Please remember that this is for non-urgent requests.  ?_______________________________________________________ ? ?Please go to the lab on the 2nd floor [address] before you leave the office today.  ? ?We have sent the following medications to your pharmacy for you to pick up at your convenience: ?Omeprazole ? ?You have been scheduled for an MRI MRCP at [facility] on Jul 10, 2021. Your appointment time is 830am. Please arrive to admitting (at main entrance of the hospital) 15 minutes prior to your appointment time for registration purposes. Please make certain not to have anything to eat or drink 6 hours prior to your test. In addition, if you have any metal in your body, have a pacemaker or defibrillator, please be sure to let your ordering physician know. This test typically takes 45 minutes to 1 hour to complete. Should you need to reschedule, please call [phone] to do so. ? ?Please call with any questions or concerns. ? ?Thank you, ? ?Dr. Lynann Bologna ? ? ? ? ? ?We want to thank you for trusting [facility] with your care. All of our staff and providers  value the relationships we have built with our patients, and it is an honor to care for you.  ? ?We are writing to let you know that [facility] will close on Jul 23, 2021, and we invite you to continue to see Dr. Edman Circle and Doristine Locks at the [facility] [place] office location. We are consolidating our serices at these [facility] practices to better provide care. Our office staff will work with you to ensure a seamless transition.  ? ?Doristine Locks, DO -Dr. Barron Alvine will be movig to [facility] at [address], [place], [place] [address], effective Jul 23, 2021.  Contact [phone] to schedule an appointment with him.  ? ?Edman Circle, MD- Dr. Chales Abrahams will be movig to [facility] at [address] [place], [place] [address], effective Jul 23, 2021.  Contact [phone] to schedule an appointment with him.  ? ?Requesting Medical Records ?If you need to request your medical records, please follow the instructions below. Your medical records are confidential, and a copy can be transferred to another provider or released to you or another person you designate only with your permission. ? ?There are several ways to request your medical records: ?Requests for medical records can be submitted through our practice.   ?You can also request your records electronically, in your MyChart account by selecting the ?Request Health Records? tab.  ?If you need additional information on how to request records, please go to [email or web address], choose Patient Information, then select Request Medical  Records. ?To make an appointment or if you have any questions about your health care needs, please contact our office at [phone] and one of our staff members will be glad to assist you. ?[facility] is committed to providing exceptional care for you and our community. Thank you for allowing [place] to serve your health care needs. ?Sincerely, ? ?Trixie Dredge,  Director [facility] ?[facility] also offers convenient virtual care options. Sore throat? Sinus problems? Cold or flu symptoms? Get care from the comfort of home with [facility] Video Visits and e-Visits. Learn more about the non-emergency conditions treated and start your virtual visit at [email or web address] ? ? ?

## 2021-06-19 NOTE — Progress Notes (Signed)
? ? ?Chief Complaint: FU ? ?Referring Provider:  Department, [place]    ? ? ?ASSESSMENT AND PLAN;  ? ?#1. Epi pain with GERD ? ?#2. Incidental asymptomatic TOP (tail of pancreas) 1.9 cm cystic lesion. No red flag signs or symptoms. No H/O pancreatitis, trauma. Nl PD, no intramural nodules. Nl CA 19-9 10/2020 ? ? ?Plan: ?-Omeprazole 20mg  po QD #90, 4RF. If still problems and failed trial of omeprazole, then EGD. ?-MRI with MRCP  ?-Check CBC, CMP, lipase, CA19-9. ?-FU after MRI/MRCP.  If any change or enlarging cyst, would consider EUS. ?-Minimize nonsteroidals. ?-Discussed in detail with the patient through the interpreter. ? ? ?HPI:   ? ?Erin Carney is a 36 y.o. female  ? ?C/O Epi pain with associated heartburn for 4 to 5 months.  She could not identify any definite foods which make it worse.  She denies having any odynophagia or dysphagia.  No melena or hematochezia.  No red flag symptoms.  No nausea or vomiting. ? ?Has been taking ibuprofen for hip pain on as-needed basis.  She also takes Tylenol. ? ?She did not come for MRI appointment.  Would like to be scheduled. ? ?For incidental finding-cystic lesion in the tail of the pancreas on CT looking followed by MRI. ? ?No jaundice or dark urine.  No diarrhea or constipation.  No unintentional weight loss.  Has lost 3 pounds since the last visit. ? ?No Jaundice, dark urine or pale stools.  No history of alcohol use.  No history of pancreatitis. ? ?Occ LLQ pain and R back/hip x many yrs. ? ?Wt Readings from Last 3 Encounters:  ?06/19/21 181 lb 2 oz (82.2 kg)  ?02/09/21 184 lb 3.2 oz (83.6 kg)  ?11/06/20 187 lb 2 oz (84.9 kg)  ? ? ? ? ? ?Previous GI work-up: ? ?CT AP 10/2020 ?-No acute intra-abdominal pathology identified. No definite ?radiographic explanation for the patient's reported symptoms. ?  ?-19 mm indeterminate low-attenuation lesion within the tail the ?pancreas. This would be better assessed with EUS or pancreatic ?protocol MRI examination. ?   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 ?Clinical correlation for signs and symptoms of pelvic venous ?congestion may be helpful. Interventional radiology consultation may ?also be helpful for further management. ? ?MRI 10/2020 ?1. Minimally septated pancreatic tail cystic lesion of 1.7 cm. ?Differential considerations include pseudocyst or indolent cystic ?neoplasm. Per consensus criteria, clinical strategies include repeat ?MRI/MRCP at 6 months versus further evaluation with endoscopic ?ultrasound. This recommendation follows ACR consensus guidelines: ?Management of Incidental Pancreatic Cysts: A White Paper of the ACR ?Incidental Findings Committee. J Am Coll Radiol 2017;14:911-923. ?2.  No acute abdominal process. ?3. Mild hepatic steatosis ? ? ? ?Past Medical History:  ?Diagnosis Date  ? Medical history non-contributory   ? ? ?Past Surgical History:  ?Procedure Laterality Date  ? NO PAST SURGERIES    ? ? ?Family History  ?Problem Relation Age of Onset  ? Alcohol abuse Neg Hx   ? Arthritis Neg Hx   ? Asthma Neg Hx   ? Birth defects Neg Hx   ? Cancer Neg Hx   ? COPD Neg Hx   ? Depression Neg Hx   ? Diabetes Neg Hx   ? Drug abuse Neg Hx   ? Early death Neg Hx   ? Hearing loss Neg Hx   ? Heart disease Neg Hx   ? Hyperlipidemia Neg Hx   ?  Kidney disease Neg Hx   ? Learning disabilities Neg Hx   ? Mental illness Neg Hx   ? Mental retardation Neg Hx   ? Miscarriages / Stillbirths Neg Hx   ? Stroke Neg Hx   ? Vision loss Neg Hx   ? Varicose Veins Neg Hx   ? Pancreatic cancer Neg Hx   ? Colon cancer Neg Hx   ? Hypertension Neg Hx   ? Rectal cancer Neg Hx   ? Stomach cancer Neg Hx   ? ? ?Social History  ? ?Tobacco Use  ? Smoking status: Never  ? Smokeless tobacco: Never  ?Vaping Use  ? Vaping Use: Never used  ?Substance Use Topics  ? Alcohol use: No  ? Drug use: No  ? ? ?Current Outpatient Medications  ?Medication Sig Dispense  Refill  ? acetaminophen (TYLENOL) 500 MG tablet Take 500 mg by mouth every 6 (six) hours as needed for moderate pain or headache.    ? ibuprofen (ADVIL) 200 MG tablet Take 200 mg by mouth every 6 (six) hours as needed for headache or moderate pain.    ? nortriptyline (PAMELOR) 10 MG capsule Take 1 capsule (10 mg total) by mouth at bedtime. (Patient not taking: Reported on 06/19/2021) 30 capsule 5  ? SUMAtriptan (IMITREX) 100 MG tablet Take 1 tablet (100 mg total) by mouth as needed for up to 1 dose for migraine (May repeat in 2 hours.  Maximum 2 tablets in 24 hours.). May repeat in 2 hours if headache persists or recurs. (Patient not taking: Reported on 06/19/2021) 10 tablet 5  ? ?No current facility-administered medications for this visit.  ? ? ?No Known Allergies ? ?Review of Systems:  ?Constitutional: Denies fever, chills, diaphoresis, appetite change and fatigue.  ?HEENT: Denies photophobia, eye pain, redness, hearing loss, ear pain, congestion, sore throat, rhinorrhea, sneezing, mouth sores, neck pain, neck stiffness and tinnitus.   ?Respiratory: Denies SOB, DOE, cough, chest tightness,  and wheezing.   ?Cardiovascular: Denies chest pain, palpitations and leg swelling.  ?Genitourinary: Denies dysuria, urgency, frequency, hematuria, flank pain and difficulty urinating.  ?Musculoskeletal: Denies myalgias, back pain, joint swelling, arthralgias and gait problem.  ?Skin: No rash.  ?Neurological: Denies dizziness, seizures, syncope, weakness, light-headedness, numbness and headaches.  ?Hematological: Denies adenopathy. Easy bruising, personal or family bleeding history  ?Psychiatric/Behavioral: No anxiety or depression ? ?  ? ?Physical Exam:   ? ?BP 102/78   Pulse (!) 55   Ht 5\' 1"  (1.549 m)   Wt 181 lb 2 oz (82.2 kg)   SpO2 98%   BMI 34.22 kg/m?  ?Wt Readings from Last 3 Encounters:  ?06/19/21 181 lb 2 oz (82.2 kg)  ?02/09/21 184 lb 3.2 oz (83.6 kg)  ?11/06/20 187 lb 2 oz (84.9 kg)  ? ?Constitutional:   Well-developed, in no acute distress. ?Psychiatric: Normal mood and affect. Behavior is normal. ?HEENT: Pupils normal.  Conjunctivae are normal. No scleral icterus. ?Cardiovascular: Normal rate, regular rhythm. No edema ?Pulmonary/chest: Effort normal and breath sounds normal. No wheezing, rales or rhonchi. ?Abdominal: Soft, nondistended. Nontender. Bowel sounds active throughout. There are no masses palpable. No hepatomegaly. ?Rectal: Deferred ?Neurological: Alert and oriented to person place and time. ?Skin: Skin is warm and dry. No rashes noted. ? ?Data Reviewed: I have personally reviewed following labs and imaging studies ? ?CBC: ? ?  Latest Ref Rng & Units 10/29/2020  ? 12:50 AM 10/27/2020  ?  6:35 PM 04/28/2017  ?  5:21 AM  ?CBC  ?  WBC 4.0 - 10.5 K/uL 4.1   4.7   8.4    ?Hemoglobin 12.0 - 15.0 g/dL 01.7   49.4   49.6    ?Hematocrit 36.0 - 46.0 % 37.1   39.9   30.9    ?Platelets 150 - 400 K/uL 201   230   223    ? ? ?CMP: ? ?  Latest Ref Rng & Units 10/29/2020  ? 12:50 AM 10/27/2020  ?  6:35 PM  ?CMP  ?Glucose 70 - 99 mg/dL 759   89    ?BUN 6 - 20 mg/dL 16   10    ?Creatinine 0.44 - 1.00 mg/dL 1.63   8.46    ?Sodium 135 - 145 mmol/L 137   131    ?Potassium 3.5 - 5.1 mmol/L 3.6   3.7    ?Chloride 98 - 111 mmol/L 107   101    ?CO2 22 - 32 mmol/L 25   22    ?Calcium 8.9 - 10.3 mg/dL 8.9   8.7    ?Total Protein 6.5 - 8.1 g/dL  7.3    ?Total Bilirubin 0.3 - 1.2 mg/dL  0.7    ?Alkaline Phos 38 - 126 U/L  69    ?AST 15 - 41 U/L  18    ?ALT 0 - 44 U/L  17    ? ? ? ?No results found for this or any previous visit (from the past 240 hour(s)). ?  ? ? ?Radiology Studies: ?No results found. ? ? ? ?CT and MRI was independently reviewed. ? ? ?Edman Circle, MD 06/19/2021, 8:58 AM ? ?Cc: Department, [place] ? ? ?

## 2021-06-20 LAB — CANCER ANTIGEN 19-9: CA 19-9: 11 U/mL (ref <34)

## 2021-07-10 ENCOUNTER — Other Ambulatory Visit: Payer: Self-pay | Admitting: Gastroenterology

## 2021-07-10 ENCOUNTER — Ambulatory Visit (HOSPITAL_COMMUNITY)
Admission: RE | Admit: 2021-07-10 | Discharge: 2021-07-10 | Disposition: A | Discharge status: Home / Self Care | Payer: Medicaid Other | Source: Ambulatory Visit | Attending: Gastroenterology | Admitting: Gastroenterology

## 2021-07-10 DIAGNOSIS — R1013 Epigastric pain: Secondary | ICD-10-CM | POA: Insufficient documentation

## 2021-07-10 DIAGNOSIS — K219 Gastro-esophageal reflux disease without esophagitis: Secondary | ICD-10-CM | POA: Diagnosis present

## 2021-07-10 DIAGNOSIS — K869 Disease of pancreas, unspecified: Secondary | ICD-10-CM

## 2021-07-10 MED ORDER — GADOBUTROL 1 MMOL/ML IV SOLN
8.0000 mL | Freq: Once | INTRAVENOUS | Status: AC | PRN
  Administered 2021-07-10: 8 mL via INTRAVENOUS

## 2021-08-09 ENCOUNTER — Encounter: Payer: Medicaid Other | Admitting: Obstetrics and Gynecology

## 2021-09-12 ENCOUNTER — Other Ambulatory Visit (HOSPITAL_COMMUNITY)
Admission: RE | Admit: 2021-09-12 | Discharge: 2021-09-12 | Disposition: A | Discharge status: Home / Self Care | Payer: Medicaid Other | Source: Ambulatory Visit | Attending: Obstetrics and Gynecology | Admitting: Obstetrics and Gynecology

## 2021-09-12 ENCOUNTER — Other Ambulatory Visit: Payer: Self-pay

## 2021-09-12 ENCOUNTER — Encounter: Payer: Self-pay | Admitting: Obstetrics and Gynecology

## 2021-09-12 ENCOUNTER — Ambulatory Visit: Payer: Medicaid Other | Admitting: Obstetrics and Gynecology

## 2021-09-12 VITALS — BP 108/68 | HR 69 | Wt 181.0 lb

## 2021-09-12 DIAGNOSIS — Z124 Encounter for screening for malignant neoplasm of cervix: Secondary | ICD-10-CM

## 2021-09-12 DIAGNOSIS — Z789 Other specified health status: Secondary | ICD-10-CM

## 2021-09-12 DIAGNOSIS — R102 Pelvic and perineal pain: Secondary | ICD-10-CM | POA: Diagnosis not present

## 2021-09-12 NOTE — Progress Notes (Signed)
Patient went to ED roughly  7-8 months ago and a MRI was performed and that's she was informed about the cyst on left ovary

## 2021-09-12 NOTE — Progress Notes (Signed)
Obstetrics and Gynecology New Patient Evaluation  Appointment Date: 09/12/2021  OBGYN Clinic: [facility] for [facility]  Primary Care Provider: Medicine, [facility]  Referring Provider: [facility]  Chief Complaint:  Chief Complaint  Patient presents with   Gynecologic Exam    History of Present Illness: Erin Carney is a 36 y.o. [id number] (Patient's last menstrual period was 08/29/2021 (exact date).), seen for the above chief complaint.   Patient states she was seen in the ED last year for abdominal pain and was told she had a cyst on her ovary and she needed follow up. She states that she wasn't able to get in until now.   She states that she gets gassy with her periods, and has back pain (patient points to upper mid back) and that she also feels like she has to go void when she has pain with her periods. She also states that her periods are qmonth, regular but recently hasn't been just a few days and can last for up to a week to 10 days  No breast s/s, fevers, chills, chest pain, SOB, nausea, vomiting, abdominal pain, dysuria, hematuria, vaginal itching, dyspareunia, SUI or OAB, diarrhea, constipation, blood in BMs  Review of Systems: Pertinent items noted in HPI and remainder of comprehensive ROS otherwise negative.    Past Medical History:  Past Medical History:  Diagnosis Date   Medical history non-contributory     Past Surgical History:  Past Surgical History:  Procedure Laterality Date   NO PAST SURGERIES      Past Obstetrical History:  OB History  Gravida Para Term Preterm AB Living  6 4 3 1 2 4   SAB IAB Ectopic Multiple Live Births  2     0 4    # Outcome Date GA Lbr Len/2nd Weight Sex Delivery Anes PTL Lv  6 Preterm 04/27/17 [redacted]w[redacted]d   F Vag-Spont   LIV  5 Term 02/22/15 [redacted]w[redacted]d 04:45 / 00:09 8 lb 7.1 oz (3.83 kg) M Vag-Spont None  LIV  4 SAB 2008          3 SAB 2005          2 Term 04/11/01    06/09/01 Vag-Spont   LIV  1  Term 09/05/98 [redacted]w[redacted]d   M Vag-Spont   LIV    Past Gynecological History: As per HPI. History of Pap Smear(s): she states she had one earlier this year with her PCP She is currently using condoms for contraception.   Social History:  Social History   Socioeconomic History   Marital status: Single    Spouse name: Not on file   Number of children: 4   Years of education: Not on file   Highest education level: Not on file  Occupational History   Occupation: home maker  Tobacco Use   Smoking status: Never   Smokeless tobacco: Never  Vaping Use   Vaping Use: Never used  Substance and Sexual Activity   Alcohol use: No   Drug use: No   Sexual activity: Yes    Birth control/protection: None  Other Topics Concern   Not on file  Social History Narrative   Not on file   Social Determinants of Health   Financial Resource Strain: Not on file  Food Insecurity: Not on file  Transportation Needs: Not on file  Physical Activity: Not on file  Stress: Not on file  Social Connections: Not on file  Intimate Partner Violence: Not on  file    Family History:  Family History  Problem Relation Age of Onset   Alcohol abuse Neg Hx    Arthritis Neg Hx    Asthma Neg Hx    Birth defects Neg Hx    Cancer Neg Hx    COPD Neg Hx    Depression Neg Hx    Diabetes Neg Hx    Drug abuse Neg Hx    Early death Neg Hx    Hearing loss Neg Hx    Heart disease Neg Hx    Hyperlipidemia Neg Hx    Kidney disease Neg Hx    Learning disabilities Neg Hx    Mental illness Neg Hx    Mental retardation Neg Hx    Miscarriages / Stillbirths Neg Hx    Stroke Neg Hx    Vision loss Neg Hx    Varicose Veins Neg Hx    Pancreatic cancer Neg Hx    Colon cancer Neg Hx    Hypertension Neg Hx    Rectal cancer Neg Hx    Stomach cancer Neg Hx    Medications Erin Carney had no medications administered during this visit. Current Outpatient Medications  Medication Sig Dispense Refill   omeprazole  (PRILOSEC) 20 MG capsule Take 1 capsule (20 mg total) by mouth daily. 90 capsule 4   acetaminophen (TYLENOL) 500 MG tablet Take 500 mg by mouth every 6 (six) hours as needed for moderate pain or headache. (Patient not taking: Reported on 09/12/2021)     ibuprofen (ADVIL) 200 MG tablet Take 200 mg by mouth every 6 (six) hours as needed for headache or moderate pain. (Patient not taking: Reported on 09/12/2021)     nortriptyline (PAMELOR) 10 MG capsule Take 1 capsule (10 mg total) by mouth at bedtime. (Patient not taking: Reported on 06/19/2021) 30 capsule 5   SUMAtriptan (IMITREX) 100 MG tablet Take 1 tablet (100 mg total) by mouth as needed for up to 1 dose for migraine (May repeat in 2 hours.  Maximum 2 tablets in 24 hours.). May repeat in 2 hours if headache persists or recurs. (Patient not taking: Reported on 06/19/2021) 10 tablet 5   No current facility-administered medications for this visit.    Allergies Patient has no known allergies.   Physical Exam:  BP 108/68   Pulse 69   Wt 181 lb (82.1 kg)   LMP 08/29/2021 (Exact Date)   Breastfeeding No   BMI 34.20 kg/m  Body mass index is 34.2 kg/m. General appearance: Well nourished, well developed female in no acute distress.  Back: no CVAT Cardiovascular: normal s1 and s2.  No murmurs, rubs or gallops. Respiratory:  Clear to auscultation bilateral. Normal respiratory effort Abdomen: positive bowel sounds and no masses, hernias; diffusely non tender to palpation, non distended Neuro/Psych:  Normal mood and affect.  Skin:  Warm and dry.  Lymphatic:  No inguinal lymphadenopathy.   Pelvic exam: is not limited by body habitus EGBUS: within normal limits Vagina: within normal limits and with no blood or discharge in the vault Cervix: normal appearing cervix without tenderness, discharge or lesions. Uterus:  nonenlarged and non tender Adnexa:  normal adnexa and no mass, fullness, tenderness Rectovaginal: deferred  Laboratory: as per  HPI  Radiology: as per HPI. Images and read reviewed with patient.  Narrative & Impression  CLINICAL DATA:  Left lower quadrant abdominal pain   EXAM: CT ABDOMEN AND PELVIS WITH CONTRAST   TECHNIQUE: Multidetector CT imaging of the abdomen  and pelvis was performed using the standard protocol following bolus administration of intravenous contrast.   CONTRAST:  34mL OMNIPAQUE IOHEXOL 350 MG/ML SOLN   COMPARISON:  None.   FINDINGS: Lower chest: No acute abnormality.   Hepatobiliary: No focal liver abnormality is seen. No gallstones, gallbladder wall thickening, or biliary dilatation.   Pancreas: There is a 1.1 x 1.9 x 1.5 cm indeterminate low-attenuation lesion within the tail of the pancreas best seen on axial image # 25 and coronal image # 82. The pancreas is otherwise unremarkable. No peripancreatic adenopathy or fluid collections are identified.   Spleen: Unremarkable   Adrenals/Urinary Tract: Adrenal glands are unremarkable. Kidneys are normal, without renal calculi, focal lesion, or hydronephrosis. Bladder is unremarkable.   Stomach/Bowel: Stomach is within normal limits. Appendix appears normal. No evidence of bowel wall thickening, distention, or inflammatory changes. No free intraperitoneal gas or fluid.   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 The right common, external, and internal iliac veins appear patent. The abdominal vasculature is otherwise unremarkable. No pathologic adenopathy within the abdomen and pelvis.   Reproductive: Uterus and bilateral adnexa are unremarkable.   Other: No abdominal wall hernia.  Rectum is unremarkable.   Musculoskeletal: No acute bone abnormality. Osseous structures are age-appropriate.   IMPRESSION: No acute intra-abdominal pathology identified. No definite radiographic explanation for the patient's reported symptoms.    19 mm indeterminate low-attenuation lesion within the tail the pancreas. This would be better assessed with EUS or pancreatic protocol MRI examination.   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 Clinical correlation for signs and symptoms of pelvic venous congestion may be helpful. Interventional radiology consultation may also be helpful for further management.     Electronically Signed   By: Helyn Numbers M.D.   On: 10/27/2020 22:35   Assessment: pt stable  Plan: 1. Cervical cancer screening Will try and get records and if unsuccessful will send pap - Cytology - PAP( [facility])  2. Pelvic pain I d/w her that normally if anything significant is present with her uterus and adnexa it would be seen on CT, but if she would like an u/s then that's fine, which she does. I told her that hormones to suppress ovulation may be beneficial for her in addition to providing birth control if she desires the latter. Will follow up at next visit.   I told her I recommend an early cycle u/s, about a week after her period starts. Pt to call us when her period starts to schedule an u/s for about a week later  RTC after u/s  Eda (interpreter) used.   Cornelia Copa MD Attending Center for [employer])

## 2021-09-13 LAB — CYTOLOGY - PAP
Comment: NEGATIVE
Diagnosis: NEGATIVE
High risk HPV: NEGATIVE

## 2021-09-14 ENCOUNTER — Telehealth: Payer: Self-pay | Admitting: General Practice

## 2021-09-14 NOTE — Telephone Encounter (Signed)
Called patient Erin Carney assisting with spanish interpretation and informed her of results and recommended follow up. Patient verbalized understanding.

## 2021-09-14 NOTE — Telephone Encounter (Signed)
-----   Message from [place] Bing, MD sent at 09/13/2021  7:58 PM EDT ----- Please let her know that her pap smear is normal and we recommend another pap smear in 3-5 years

## 2021-09-19 NOTE — Progress Notes (Deleted)
NEUROLOGY FOLLOW UP OFFICE NOTE  Erin Carney [id number]  Assessment/Plan:   Chronic migraine without aura, without status migrainosus, not intractable Vertigo Left sided sciatica   Migraine prevention:  start nortriptyline 10mg  at bedtime (also may help with sciatica).  We can increase to 25mg  at bedtime in 4 weeks if needed Migraine rescue:  advised her to stop ibuprofen and Tylenol and try sumatriptan 100mg  (Zofran for nausea) Limit use of pain relievers to no more than 2 days out of week to prevent risk of rebound or medication-overuse headache. Keep headache diary Follow up 7-8 months.     Subjective:  Erin Carney is a 36 year old right-handed female who follows up for migraines and vertigo.   UPDATE: Started nortriptyline last visit.  Advised her to stop use of ibuprofen and Tylenol Intensity:  mild or severe Duration:  depends on stress, up to 5 hours.Treats with Tylenol or Advil Frequency:  2 to 3 times a week.    Current NSAIDs/analgesics: none Current triptan:  sumatriptan 100mg  Current antiemetic:  Zofran 4mg  Current antidepressant:  nortriptyline 10mg  QHS   HISTORY: Symptoms started in October 2021.  Dizziness occurs separately or with headache.  Headaches are mild to severe bitemporal pressure with nausea, phonophobia and sometimes sees lights but no vomiting, photophobia, numbness or weakness.  They last all day and may occur 2-3 days a week or up to the entire week.  She averages about 15 headache days a month.  Bending over causes increased head pressure.  Dizziness is described as a spinning sensation.  It occurs when sitting still and is aggravated with head movements.  It occurs twice a day and lasts up to 5 minutes.  She also reports itching in her left ear.  She also notes some reduced hearing loss bilaterally but worse in the left ear.  Epley maneuver helpful but only temporarily.  CT head on 06/02/2020 personally reviewed was normal.  Saw ENT.   No sensorineural hearing loss.  Had cerumen disimpaction.   She also sometimes has left sciatica that is aggravated when she lifts her children.  PT helped.   Past NSAID/analgesic:  Tylenol, ibuprofen Past antiemetic:  Promethazine (made her feel "bad"),  Past antiepileptic:  topiramate  Past antihistamine:  Meclizine (makes her sleepy),   PAST MEDICAL HISTORY: Past Medical History:  Diagnosis Date   Medical history non-contributory     MEDICATIONS: Current Outpatient Medications on File Prior to Visit  Medication Sig Dispense Refill   acetaminophen (TYLENOL) 500 MG tablet Take 500 mg by mouth every 6 (six) hours as needed for moderate pain or headache. (Patient not taking: Reported on 09/12/2021)     ibuprofen (ADVIL) 200 MG tablet Take 200 mg by mouth every 6 (six) hours as needed for headache or moderate pain. (Patient not taking: Reported on 09/12/2021)     nortriptyline (PAMELOR) 10 MG capsule Take 1 capsule (10 mg total) by mouth at bedtime. (Patient not taking: Reported on 06/19/2021) 30 capsule 5   omeprazole (PRILOSEC) 20 MG capsule Take 1 capsule (20 mg total) by mouth daily. 90 capsule 4   SUMAtriptan (IMITREX) 100 MG tablet Take 1 tablet (100 mg total) by mouth as needed for up to 1 dose for migraine (May repeat in 2 hours.  Maximum 2 tablets in 24 hours.). May repeat in 2 hours if headache persists or recurs. (Patient not taking: Reported on 06/19/2021) 10 tablet 5   No current facility-administered medications on file prior to visit.  ALLERGIES: No Known Allergies  FAMILY HISTORY: Family History  Problem Relation Age of Onset   Alcohol abuse Neg Hx    Arthritis Neg Hx    Asthma Neg Hx    Birth defects Neg Hx    Cancer Neg Hx    COPD Neg Hx    Depression Neg Hx    Diabetes Neg Hx    Drug abuse Neg Hx    Early death Neg Hx    Hearing loss Neg Hx    Heart disease Neg Hx    Hyperlipidemia Neg Hx    Kidney disease Neg Hx    Learning disabilities Neg Hx    Mental  illness Neg Hx    Mental retardation Neg Hx    Miscarriages / Stillbirths Neg Hx    Stroke Neg Hx    Vision loss Neg Hx    Varicose Veins Neg Hx    Pancreatic cancer Neg Hx    Colon cancer Neg Hx    Hypertension Neg Hx    Rectal cancer Neg Hx    Stomach cancer Neg Hx       Objective:  *** General: No acute distress.  Patient appears well-groomed.   Head:  Normocephalic/atraumatic Eyes:  Fundi examined but not visualized Neck: supple, no paraspinal tenderness, full range of motion Heart:  Regular rate and rhythm Neurological Exam: alert and oriented to person, place, and time.  Speech fluent and not dysarthric, language intact.  CN II-XII intact. Bulk and tone normal, muscle strength 5/5 throughout.  Sensation to light touch intact.  Deep tendon reflexes 2+ throughout, toes downgoing.  Finger to nose testing intact.  Gait normal, Romberg negative.   Shon Millet, DO

## 2021-09-20 ENCOUNTER — Ambulatory Visit: Payer: Medicaid Other | Admitting: Neurology

## 2021-09-26 ENCOUNTER — Ambulatory Visit (HOSPITAL_COMMUNITY): Admission: RE | Admit: 2021-09-26 | Payer: Medicaid Other | Source: Ambulatory Visit

## 2021-11-07 ENCOUNTER — Telehealth: Payer: Self-pay | Admitting: Obstetrics and Gynecology

## 2021-11-07 NOTE — Telephone Encounter (Signed)
Called pt w/interpreter [employer] and obtained the voice mail for a female. A message was left stating that this call is for [facility] and requested that she call our office. Pt will need to be notified of appt made for [place] @ [facility] on 9/7 @ 9:00 am.  She will need to arrive @ 8:45 am with a full bladder.

## 2021-11-07 NOTE — Telephone Encounter (Signed)
Patient was told to call to get a [facility] appointment when she start her cycle.

## 2021-11-13 ENCOUNTER — Ambulatory Visit (HOSPITAL_BASED_OUTPATIENT_CLINIC_OR_DEPARTMENT_OTHER)
Admission: RE | Admit: 2021-11-13 | Discharge: 2021-11-13 | Disposition: A | Discharge status: Home / Self Care | Payer: Medicaid Other | Source: Ambulatory Visit | Attending: Obstetrics and Gynecology | Admitting: Obstetrics and Gynecology

## 2021-11-13 DIAGNOSIS — R102 Pelvic and perineal pain: Secondary | ICD-10-CM | POA: Diagnosis not present

## 2021-11-14 ENCOUNTER — Telehealth: Payer: Self-pay | Admitting: General Practice

## 2021-11-14 NOTE — Telephone Encounter (Signed)
-----   Message from Dover Bing, MD sent at 11/13/2021 10:15 PM EDT ----- Can you let her know that her ultrasound is completely normal? Thank you

## 2021-11-14 NOTE — Telephone Encounter (Signed)
Called patient with Erin Carney assisting with spanish interpretation & informed her of normal ultrasound results. Patient verbalized understanding and asked why she is still having pain. Told patient the doctor wanted her to follow up with [place] after the ultrasound. Told patient someone from the front office will contact her with a follow up appt. Patient verbalized understanding.

## 2021-11-15 ENCOUNTER — Other Ambulatory Visit: Payer: Medicaid Other

## 2022-01-11 ENCOUNTER — Ambulatory Visit: Payer: Medicaid Other | Admitting: Obstetrics and Gynecology

## 2022-01-22 NOTE — Progress Notes (Deleted)
NEUROLOGY FOLLOW UP OFFICE NOTE  Erin Carney [id number]  Assessment/Plan:   Chronic migraine without aura, without status migrainosus, not intractable Vertigo Left sided sciatica   Migraine prevention:  start nortriptyline 10mg  at bedtime (also may help with sciatica).  We can increase to 25mg  at bedtime in 4 weeks if needed Migraine rescue:  advised her to stop ibuprofen and Tylenol and try sumatriptan 100mg  (Zofran for nausea) Limit use of pain relievers to no more than 2 days out of week to prevent risk of rebound or medication-overuse headache. Keep headache diary Follow up 7-8 months.     Subjective:  Erin Carney is a 36 year old right-handed female who follows up for migraines and vertigo.   UPDATE: Current medications:  none  For headaches, started nortriptyline last December.  *** sumatriptan *** Zofran ***   HISTORY: Symptoms started in October 2021.  Dizziness occurs separately or with headache.  Headaches are mild to severe bitemporal pressure with nausea, phonophobia and sometimes sees lights but no vomiting, photophobia, numbness or weakness.  They last all day and may occur 2-3 days a week or up to the entire week.  She averages about 15 headache days a month.  Bending over causes increased head pressure.  Dizziness is described as a spinning sensation.  It occurs when sitting still and is aggravated with head movements.  It occurs twice a day and lasts up to 5 minutes.  She also reports itching in her left ear.  She also notes some reduced hearing loss bilaterally but worse in the left ear.  Epley maneuver helpful but only temporarily.  CT head on 06/02/2020 personally reviewed was normal.  She saw ENT for hearing loss and dizziness. She said they performed cerumen disimpaction.  She didn't have any sensorineural hearing loss.     She also sometimes has left sciatica that is aggravated when she lifts her children.  Physical therapy was helpful.      Past medications:  Promethazine (made her feel "bad"), topiramate (made her feel "worse"), sumatriptan 100mg , Meclizine (makes her sleepy), nortriptyline (***)  PAST MEDICAL HISTORY: Past Medical History:  Diagnosis Date   Medical history non-contributory     MEDICATIONS: Current Outpatient Medications on File Prior to Visit  Medication Sig Dispense Refill   acetaminophen (TYLENOL) 500 MG tablet Take 500 mg by mouth every 6 (six) hours as needed for moderate pain or headache. (Patient not taking: Reported on 09/12/2021)     ibuprofen (ADVIL) 200 MG tablet Take 200 mg by mouth every 6 (six) hours as needed for headache or moderate pain. (Patient not taking: Reported on 09/12/2021)     nortriptyline (PAMELOR) 10 MG capsule Take 1 capsule (10 mg total) by mouth at bedtime. (Patient not taking: Reported on 06/19/2021) 30 capsule 5   omeprazole (PRILOSEC) 20 MG capsule Take 1 capsule (20 mg total) by mouth daily. 90 capsule 4   SUMAtriptan (IMITREX) 100 MG tablet Take 1 tablet (100 mg total) by mouth as needed for up to 1 dose for migraine (May repeat in 2 hours.  Maximum 2 tablets in 24 hours.). May repeat in 2 hours if headache persists or recurs. (Patient not taking: Reported on 06/19/2021) 10 tablet 5   No current facility-administered medications on file prior to visit.    ALLERGIES: No Known Allergies  FAMILY HISTORY: Family History  Problem Relation Age of Onset   Alcohol abuse Neg Hx    Arthritis Neg Hx    Asthma  Neg Hx    Birth defects Neg Hx    Cancer Neg Hx    COPD Neg Hx    Depression Neg Hx    Diabetes Neg Hx    Drug abuse Neg Hx    Early death Neg Hx    Hearing loss Neg Hx    Heart disease Neg Hx    Hyperlipidemia Neg Hx    Kidney disease Neg Hx    Learning disabilities Neg Hx    Mental illness Neg Hx    Mental retardation Neg Hx    Miscarriages / Stillbirths Neg Hx    Stroke Neg Hx    Vision loss Neg Hx    Varicose Veins Neg Hx    Pancreatic cancer Neg Hx     Colon cancer Neg Hx    Hypertension Neg Hx    Rectal cancer Neg Hx    Stomach cancer Neg Hx       Objective:  *** General: No acute distress.  Patient appears ***-groomed.   Head:  Normocephalic/atraumatic Eyes:  Fundi examined but not visualized Neck: supple, no paraspinal tenderness, full range of motion Heart:  Regular rate and rhythm Lungs:  Clear to auscultation bilaterally Back: No paraspinal tenderness Neurological Exam: alert and oriented to person, place, and time.  Speech fluent and not dysarthric, language intact.  CN II-XII intact. Bulk and tone normal, muscle strength 5/5 throughout.  Sensation to light touch intact.  Deep tendon reflexes 2+ throughout, toes downgoing.  Finger to nose testing intact.  Gait normal, Romberg negative.   Shon Millet, DO  CC: ***

## 2022-01-24 ENCOUNTER — Encounter: Payer: Self-pay | Admitting: Neurology

## 2022-01-24 ENCOUNTER — Ambulatory Visit: Payer: Medicaid Other | Admitting: Neurology

## 2022-06-04 ENCOUNTER — Emergency Department (HOSPITAL_COMMUNITY): Payer: Medicaid Other

## 2022-06-04 ENCOUNTER — Emergency Department (HOSPITAL_COMMUNITY)
Admission: EM | Admit: 2022-06-04 | Discharge: 2022-06-05 | Disposition: A | Discharge status: Home / Self Care | Payer: Medicaid Other | Attending: Emergency Medicine | Admitting: Emergency Medicine

## 2022-06-04 DIAGNOSIS — E876 Hypokalemia: Secondary | ICD-10-CM

## 2022-06-04 DIAGNOSIS — R519 Headache, unspecified: Secondary | ICD-10-CM

## 2022-06-04 DIAGNOSIS — R0789 Other chest pain: Secondary | ICD-10-CM

## 2022-06-04 DIAGNOSIS — R1110 Vomiting, unspecified: Secondary | ICD-10-CM | POA: Diagnosis present

## 2022-06-04 LAB — CBC
HCT: 35.4 % — ABNORMAL LOW (ref 36.0–46.0)
Hemoglobin: 12.2 g/dL (ref 12.0–15.0)
MCH: 31.8 pg (ref 26.0–34.0)
MCHC: 34.5 g/dL (ref 30.0–36.0)
MCV: 92.2 fL (ref 80.0–100.0)
Platelets: 205 10*3/uL (ref 150–400)
RBC: 3.84 MIL/uL — ABNORMAL LOW (ref 3.87–5.11)
RDW: 13.2 % (ref 11.5–15.5)
WBC: 4.7 10*3/uL (ref 4.0–10.5)
nRBC: 0 % (ref 0.0–0.2)

## 2022-06-04 NOTE — ED Triage Notes (Signed)
Pt to ED via EMS from home. Pt c/o sudden onset of CP and N/V. Pt is spanish speaking. GCS 15. Pt denies cardiac hx.   EMS: 4mg  zofran IV 20 R Hand 160/90 80 HR 204 CBG 100% RA

## 2022-06-05 LAB — I-STAT BETA HCG BLOOD, ED (MC, WL, AP ONLY): I-stat hCG, quantitative: 8.8 m[IU]/mL — ABNORMAL HIGH (ref <5)

## 2022-06-05 LAB — BASIC METABOLIC PANEL
Anion gap: 12 (ref 5–15)
BUN: 14 mg/dL (ref 6–20)
CO2: 19 mmol/L — ABNORMAL LOW (ref 22–32)
Calcium: 8.9 mg/dL (ref 8.9–10.3)
Chloride: 107 mmol/L (ref 98–111)
Creatinine, Ser: 0.67 mg/dL (ref 0.44–1.00)
GFR, Estimated: >60 mL/min (ref >60)
Glucose, Bld: 159 mg/dL — ABNORMAL HIGH (ref 70–99)
Potassium: 3 mmol/L — ABNORMAL LOW (ref 3.5–5.1)
Sodium: 138 mmol/L (ref 135–145)

## 2022-06-05 LAB — TROPONIN I (HIGH SENSITIVITY)
Troponin I (High Sensitivity): <2 ng/L (ref <18)
Troponin I (High Sensitivity): <2 ng/L (ref <18)

## 2022-06-05 LAB — HEPATIC FUNCTION PANEL
ALT: 19 U/L (ref 0–44)
AST: 21 U/L (ref 15–41)
Albumin: 3.7 g/dL (ref 3.5–5.0)
Alkaline Phosphatase: 89 U/L (ref 38–126)
Bilirubin, Direct: 0.1 mg/dL (ref 0.0–0.2)
Indirect Bilirubin: 0.5 mg/dL (ref 0.3–0.9)
Total Bilirubin: 0.6 mg/dL (ref 0.3–1.2)
Total Protein: 7 g/dL (ref 6.5–8.1)

## 2022-06-05 LAB — HCG, SERUM, QUALITATIVE: Preg, Serum: NEGATIVE

## 2022-06-05 LAB — LIPASE, BLOOD: Lipase: 40 U/L (ref 11–51)

## 2022-06-05 LAB — MAGNESIUM: Magnesium: 2.1 mg/dL (ref 1.7–2.4)

## 2022-06-05 MED ORDER — DROPERIDOL 2.5 MG/ML IJ SOLN
2.5000 mg | Freq: Once | INTRAMUSCULAR | Status: AC
  Administered 2022-06-05: 2.5 mg via INTRAVENOUS
  Filled 2022-06-05: qty 2

## 2022-06-05 MED ORDER — SODIUM CHLORIDE 0.9 % IV BOLUS
1000.0000 mL | Freq: Once | INTRAVENOUS | Status: AC
  Administered 2022-06-05: 1000 mL via INTRAVENOUS

## 2022-06-05 MED ORDER — ONDANSETRON 4 MG PO TBDP: 4.0000 mg | ORAL_TABLET | Freq: Three times a day (TID) | ORAL | 0 refills | Status: DC | PRN

## 2022-06-05 MED ORDER — POTASSIUM CHLORIDE CRYS ER 20 MEQ PO TBCR
40.0000 meq | EXTENDED_RELEASE_TABLET | Freq: Once | ORAL | Status: AC
  Administered 2022-06-05: 40 meq via ORAL
  Filled 2022-06-05: qty 2

## 2022-06-05 MED ORDER — ALUM & MAG HYDROXIDE-SIMETH 200-200-20 MG/5ML PO SUSP
30.0000 mL | Freq: Once | ORAL | Status: AC
  Administered 2022-06-05: 30 mL via ORAL
  Filled 2022-06-05: qty 30

## 2022-06-05 NOTE — ED Provider Notes (Signed)
[facility] Provider Note   CSN: [id number] Arrival date & time: 06/04/22  2259     History  Chief Complaint  Patient presents with   Chest Pain   Emesis    Erin Carney is a 37 y.o. female.  The history is provided by the patient and medical records. A language interpreter was used.  Chest Pain Associated symptoms: vomiting   Emesis Erin Carney is a 37 y.o. female who presents to the Emergency Department complaining of chest pain.  She presents to the emergency department for evaluation of chest pain that started at some point today, she cannot specify.  The pain was sudden in onset and is a pressure sensation and she feels like she is drowning and has shortness of breath because is hard to exhale.  She has associated vomiting, unclear number of episodes.  She also reports a headache that started today around the same time.  She has associated body aches for 2 days.  No fever, cough, abdominal pain.  No reported injuries.  She has had similar chest pain in the past but has been a very long time since she has had an episode and nothing as severe as today's episode.   Hx/o pancreatic cyst, migraines.      Home Medications Prior to Admission medications   Medication Sig Start Date End Date Taking? Authorizing Provider  ondansetron (ZOFRAN-ODT) 4 MG disintegrating tablet Take 1 tablet (4 mg total) by mouth every 8 (eight) hours as needed for nausea or vomiting. 06/05/22  Yes Quintella Reichert, MD  acetaminophen (TYLENOL) 500 MG tablet Take 500 mg by mouth every 6 (six) hours as needed for moderate pain or headache. Patient not taking: Reported on 09/12/2021    [provider]  ibuprofen (ADVIL) 200 MG tablet Take 200 mg by mouth every 6 (six) hours as needed for headache or moderate pain. Patient not taking: Reported on 09/12/2021    [provider]  nortriptyline (PAMELOR) 10 MG capsule Take 1 capsule (10 mg  total) by mouth at bedtime. Patient not taking: Reported on 06/19/2021 02/09/21   Pieter Partridge, DO  omeprazole (PRILOSEC) 20 MG capsule Take 1 capsule (20 mg total) by mouth daily. 06/19/21   Jackquline Denmark, MD  SUMAtriptan (IMITREX) 100 MG tablet Take 1 tablet (100 mg total) by mouth as needed for up to 1 dose for migraine (May repeat in 2 hours.  Maximum 2 tablets in 24 hours.). May repeat in 2 hours if headache persists or recurs. Patient not taking: Reported on 06/19/2021 02/09/21   Pieter Partridge, DO      Allergies    Patient has no known allergies.    Review of Systems   Review of Systems  Cardiovascular:  Positive for chest pain.  Gastrointestinal:  Positive for vomiting.  All other systems reviewed and are negative.   Physical Exam Updated Vital Signs BP 94/68   Pulse 95   Temp 98 F (36.7 C) (Oral)   Resp 15   SpO2 98%  Physical Exam Vitals and nursing note reviewed.  Constitutional:      Appearance: She is well-developed.  HENT:     Head: Normocephalic and atraumatic.  Cardiovascular:     Rate and Rhythm: Normal rate and regular rhythm.     Heart sounds: No murmur heard. Pulmonary:     Effort: Pulmonary effort is normal. No respiratory distress.     Breath sounds: Normal breath sounds.  Abdominal:     Palpations: Abdomen is soft.     Tenderness: There is no abdominal tenderness. There is no guarding or rebound.  Musculoskeletal:        General: No swelling or tenderness.     Cervical back: Neck supple.  Skin:    General: Skin is warm and dry.  Neurological:     Mental Status: She is alert and oriented to person, place, and time.     Comments: MAE symmetrically  Psychiatric:        Behavior: Behavior normal.     ED Results / Procedures / Treatments   Labs (all labs ordered are listed, but only abnormal results are displayed) Labs Reviewed  BASIC METABOLIC PANEL - Abnormal; Notable for the following components:      Result Value   Potassium 3.0 (*)    CO2  19 (*)    Glucose, Bld 159 (*)    All other components within normal limits  CBC - Abnormal; Notable for the following components:   RBC 3.84 (*)    HCT 35.4 (*)    All other components within normal limits  I-STAT BETA HCG BLOOD, ED ([facility], [facility], [facility] ONLY) - Abnormal; Notable for the following components:   I-stat hCG, quantitative 8.8 (*)    All other components within normal limits  HEPATIC FUNCTION PANEL  LIPASE, BLOOD  HCG, SERUM, QUALITATIVE  MAGNESIUM  TROPONIN I (HIGH SENSITIVITY)  TROPONIN I (HIGH SENSITIVITY)    EKG EKG Interpretation  Date/Time:  Tuesday June 04 2022 23:08:41 EDT Ventricular Rate:  75 PR Interval:  187 QRS Duration: 97 QT Interval:  389 QTC Calculation: 435 R Axis:   55 Text Interpretation: Sinus rhythm baseline wander leads I, III Artifact leads I, III No previous ECGs available Confirmed by Quintella Reichert [phone]) on 06/04/2022 11:13:27 PM  Radiology DG Chest 2 View  Result Date: 06/04/2022 CLINICAL DATA:  Chest pain and weakness EXAM: CHEST - 2 VIEW COMPARISON:  None Available. FINDINGS: The heart size and mediastinal contours are within normal limits. Both lungs are clear. The visualized skeletal structures are unremarkable. IMPRESSION: No active cardiopulmonary disease. Electronically Signed   By: Lucienne Capers M.D.   On: 06/04/2022 23:41    Procedures Procedures    Medications Ordered in ED Medications  sodium chloride 0.9 % bolus 1,000 mL (0 mLs Intravenous Stopped 06/05/22 0130)  droperidol (INAPSINE) 2.5 MG/ML injection 2.5 mg (2.5 mg Intravenous Given 06/05/22 0023)  alum & mag hydroxide-simeth (MAALOX/MYLANTA) 200-200-20 MG/5ML suspension 30 mL (30 mLs Oral Given 06/05/22 0023)  potassium chloride SA (KLOR-CON M) CR tablet 40 mEq (40 mEq Oral Given 06/05/22 0027)    ED Course/ Medical Decision Making/ A&P                             Medical Decision Making Amount and/or Complexity of Data Reviewed Labs: ordered. Radiology:  ordered.  Risk OTC drugs. Prescription drug management.   Pt here for evaluation of CP, vomiting, HA.  Pt is nontoxic appearing on evaluation.  Current clinical picture is not consistent with ACS, PE, dissection, referred abdominal pain, subarachnoid hemorrhage, meningitis.  After treatment with medications in the emergency department her pain has resolved-no persistent headache or chest discomfort.  Discussed with patient there may be an anxiety component but recommend close PCP follow-up for further evaluation.  CBC without significant anemia.  BMP with mild hypokalemia-this was replaced orally.  Patient is  not currently pregnant.        Final Clinical Impression(s) / ED Diagnoses Final diagnoses:  Atypical chest pain  Vomiting, unspecified vomiting type, unspecified whether nausea present  Bad headache  Hypokalemia    Rx / DC Orders ED Discharge Orders          Ordered    ondansetron (ZOFRAN-ODT) 4 MG disintegrating tablet  Every 8 hours PRN        06/05/22 0245              Quintella Reichert, MD 06/05/22 0425

## 2022-07-24 ENCOUNTER — Other Ambulatory Visit: Payer: Self-pay

## 2022-07-24 ENCOUNTER — Telehealth: Payer: Self-pay

## 2022-07-24 DIAGNOSIS — K869 Disease of pancreas, unspecified: Secondary | ICD-10-CM

## 2022-07-24 NOTE — Telephone Encounter (Signed)
-----   Message from Emeline Darling, RN sent at 07/23/2021 10:10 AM EDT ----- Regarding: Repeat MRI/MRCP Repeat MRI/MRCP

## 2022-07-24 NOTE — Telephone Encounter (Signed)
Reminder received in Epic: Through a [employer] officer, [employer].  Pt was ordered and scheduled for an MR Abdomen MRCP on 07/31/2022 at 8:00 AM at [facility] . Pt to arrive at 7:30 AM. Nothing  to eat or drink 4 hours prior. Pt made aware  Pt verbalized understanding with all questions answered.

## 2022-07-31 ENCOUNTER — Ambulatory Visit (HOSPITAL_COMMUNITY)
Admission: RE | Admit: 2022-07-31 | Discharge: 2022-07-31 | Disposition: A | Discharge status: Home / Self Care | Payer: Medicaid Other | Source: Ambulatory Visit | Attending: Gastroenterology | Admitting: Gastroenterology

## 2022-07-31 ENCOUNTER — Other Ambulatory Visit: Payer: Self-pay | Admitting: Gastroenterology

## 2022-07-31 DIAGNOSIS — K869 Disease of pancreas, unspecified: Secondary | ICD-10-CM | POA: Insufficient documentation

## 2022-07-31 MED ORDER — GADOBUTROL 1 MMOL/ML IV SOLN
8.0000 mL | Freq: Once | INTRAVENOUS | Status: AC | PRN
  Administered 2022-07-31: 8 mL via INTRAVENOUS

## 2023-07-21 ENCOUNTER — Encounter: Payer: Self-pay | Admitting: Gastroenterology

## 2023-07-21 ENCOUNTER — Other Ambulatory Visit (INDEPENDENT_AMBULATORY_CARE_PROVIDER_SITE_OTHER)

## 2023-07-21 ENCOUNTER — Ambulatory Visit: Admitting: Gastroenterology

## 2023-07-21 VITALS — BP 122/78 | HR 74 | Ht 62.25 in | Wt 178.0 lb

## 2023-07-21 DIAGNOSIS — K869 Disease of pancreas, unspecified: Secondary | ICD-10-CM | POA: Diagnosis not present

## 2023-07-21 DIAGNOSIS — R1013 Epigastric pain: Secondary | ICD-10-CM

## 2023-07-21 LAB — COMPREHENSIVE METABOLIC PANEL WITH GFR
ALT: 15 U/L (ref 0–35)
AST: 15 U/L (ref 0–37)
Albumin: 4.5 g/dL (ref 3.5–5.2)
Alkaline Phosphatase: 76 U/L (ref 39–117)
BUN: 13 mg/dL (ref 6–23)
CO2: 27 meq/L (ref 19–32)
Calcium: 9.5 mg/dL (ref 8.4–10.5)
Chloride: 103 meq/L (ref 96–112)
Creatinine, Ser: 0.65 mg/dL (ref 0.40–1.20)
GFR: 110.4 mL/min (ref >60.00)
Glucose, Bld: 98 mg/dL (ref 70–99)
Potassium: 3.9 meq/L (ref 3.5–5.1)
Sodium: 138 meq/L (ref 135–145)
Total Bilirubin: 0.6 mg/dL (ref 0.2–1.2)
Total Protein: 7.9 g/dL (ref 6.0–8.3)

## 2023-07-21 LAB — CBC WITH DIFFERENTIAL/PLATELET
Basophils Absolute: 0 10*3/uL (ref 0.0–0.1)
Basophils Relative: 0.8 % (ref 0.0–3.0)
Eosinophils Absolute: 0.1 10*3/uL (ref 0.0–0.7)
Eosinophils Relative: 1.4 % (ref 0.0–5.0)
HCT: 41.8 % (ref 36.0–46.0)
Hemoglobin: 13.9 g/dL (ref 12.0–15.0)
Lymphocytes Relative: 33.3 % (ref 12.0–46.0)
Lymphs Abs: 1.4 10*3/uL (ref 0.7–4.0)
MCHC: 33.4 g/dL (ref 30.0–36.0)
MCV: 94.1 fl (ref 78.0–100.0)
Monocytes Absolute: 0.3 10*3/uL (ref 0.1–1.0)
Monocytes Relative: 7.1 % (ref 3.0–12.0)
Neutro Abs: 2.5 10*3/uL (ref 1.4–7.7)
Neutrophils Relative %: 57.4 % (ref 43.0–77.0)
Platelets: 242 10*3/uL (ref 150.0–400.0)
RBC: 4.44 Mil/uL (ref 3.87–5.11)
RDW: 13.1 % (ref 11.5–15.5)
WBC: 4.3 10*3/uL (ref 4.0–10.5)

## 2023-07-21 LAB — LIPASE: Lipase: 22 U/L (ref 11.0–59.0)

## 2023-07-21 MED ORDER — FAMOTIDINE 20 MG PO TABS: 20.0000 mg | ORAL_TABLET | Freq: Two times a day (BID) | ORAL | 2 refills | Status: AC

## 2023-07-21 NOTE — Progress Notes (Signed)
 Chief Complaint:epigastric pain Primary GI Doctor: Dr. Venice Gillis   HPI:  Patient is a  38  year old Hispanic female patient, known to Dr. Venice Gillis, with past medical history of GERD, who presents for a complaint of epigastric pain .   Last seen by Dr. Venice Gillis on 06/19/21 for epigastric pain. Started on PPI Omeprazole  20 mg po daily.  Incidental asymptomatic TOP (tail of pancreas) 1.9 cm cystic lesion. No red flag signs or symptoms. No H/O pancreatitis, trauma. Nl PD, no intramural nodules. Nl CA 19-9 10/2020. Ordered MRI with MRCP-Stable septated 14 x 14 x 11 mm cystic lesion in the tail of the pancreas without discrete enhancing nodularity mural nodularity or pancreatic ductal dilation. CBC, CMP, lipase, CA19-9 labs show- lipase 21, CA 19-9 11, CBC and CMP all at baseline.  Previous GI work-up:   CT AP 10/2020 -No acute intra-abdominal pathology identified. No definite radiographic explanation for the patient's reported symptoms.   -19 mm indeterminate low-attenuation lesion within the tail the pancreas. This would be better assessed with EUS or pancreatic protocol MRI examination.   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 Clinical correlation for signs and symptoms of pelvic venous congestion [place] Brog[place] be helpful. Interventional radiology consultation Rein Popov also be helpful for further management.   MRI 10/2020 1. Minimally septated pancreatic tail cystic lesion of 1.7 cm. Differential considerations include pseudocyst or indolent cystic neoplasm. Per consensus criteria, clinical strategies include repeat MRI/MRCP at 6 months versus further evaluation with endoscopic ultrasound. This recommendation follows ACR consensus guidelines: Management of Incidental Pancreatic Cysts: A White Paper of the ACR Incidental Findings Committee. J Am Coll Radiol 2017;14:911-923. 2.  No acute abdominal process. 3. Mild  hepatic steatosis  MRCP 07/31/22 IMPRESSION: 1. Stable septated 14 x 14 x 11 mm cystic lesion in the tail of the pancreas without discrete enhancing nodularity mural nodularity or pancreatic ductal dilation. Recommend follow up pre and post contrast MRI/MRCP in 1 year. 2. Mild diffuse hepatic steatosis.    Due to language barrier, an interpreter was present during the history-taking and subsequent discussion (and for part of the physical exam) with this patient.   Interval History    Patient presents with main complaint of epigastric pain that started about 2 weeks ago. She states the Omeprazole  20 mg po daily did not help with current symptoms. She states Aloe helps with the discomfort. She has pain with and without eating. She does tell me her husband tested positive for H pylori and treated. Her kids were negative when  tested. She has not been tested.  No constipation or diarrhea. No nausea, vomiting, or weight loss. No tarry stools or blood in stool. No recent travel. No exposure. Nonsmoker. No alcohol use.  No NSAID's. No family history of stomach CA or colon CA. Denies/Admits NSAID use.  Wt Readings from Last 3 Encounters:  07/21/23 178 lb (80.7 kg)  09/12/21 181 lb (82.1 kg)  06/19/21 181 lb 2 oz (82.2 kg)    Past Medical History:  Diagnosis Date   Medical history non-contributory    Past Surgical History:  Procedure Laterality Date   NO PAST SURGERIES      No current outpatient medications on file.   No current facility-administered medications for this visit.    Allergies as of 07/21/2023   (No Known Allergies)    Family History  Problem Relation Age of Onset   Alcohol abuse Neg Hx  Arthritis Neg Hx    Asthma Neg Hx    Birth defects Neg Hx    Cancer Neg Hx    COPD Neg Hx    Depression Neg Hx    Diabetes Neg Hx    Drug abuse Neg Hx    Early death Neg Hx    Hearing loss Neg Hx    Heart disease Neg Hx    Hyperlipidemia Neg Hx    Kidney disease Neg  Hx    Learning disabilities Neg Hx    Mental illness Neg Hx    Mental retardation Neg Hx    Miscarriages / Stillbirths Neg Hx    Stroke Neg Hx    Vision loss Neg Hx    Varicose Veins Neg Hx    Pancreatic cancer Neg Hx    Colon cancer Neg Hx    Hypertension Neg Hx    Rectal cancer Neg Hx    Stomach cancer Neg Hx     Review of Systems:    Constitutional: No weight loss, fever, chills, weakness or fatigue HEENT: Eyes: No change in vision               Ears, Nose, Throat:  No change in hearing or congestion Skin: No rash or itching Cardiovascular: No chest pain, chest pressure or palpitations   Respiratory: No SOB or cough Gastrointestinal: See HPI and otherwise negative Genitourinary: No dysuria or change in urinary frequency Neurological: No headache, dizziness or syncope Musculoskeletal: No new muscle or joint pain Hematologic: No bleeding or bruising Psychiatric: No history of depression or anxiety    Physical Exam:  Vital signs: BP 122/78   Pulse 74   Ht 5' 2.25" (1.581 m)   Wt 178 lb (80.7 kg)   BMI 32.30 kg/m   Constitutional:  Pleasant Hispanic female appears to be in NAD, Well developed, Well nourished, alert and cooperative Throat: Oral cavity and pharynx without inflammation, swelling or lesion.  Respiratory: Respirations even and unlabored. Lungs clear to auscultation bilaterally.   No wheezes, crackles, or rhonchi.  Cardiovascular: Normal S1, S2. Regular rate and rhythm. No peripheral edema, cyanosis or pallor.  Gastrointestinal:  Soft, nondistended, nontender. No rebound or guarding. Normal bowel sounds. No appreciable masses or hepatomegaly. Rectal:  Not performed.  Msk:  Symmetrical without gross deformities. Without edema, no deformity or joint abnormality.  Neurologic:  Alert and  oriented x4;  grossly normal neurologically.  Skin:   Dry and intact without significant lesions or rashes. Psychiatric: Oriented to person, place and time. Demonstrates good  judgement and reason without abnormal affect or behaviors.  RELEVANT LABS AND IMAGING: CBC    Latest Ref Rng & Units 06/04/2022   11:16 PM 06/19/2021    9:33 AM 10/29/2020   12:50 AM  CBC  WBC 4.0 - 10.5 K/uL 4.7  3.7  4.1   Hemoglobin 12.0 - 15.0 g/dL 19.1  47.8  29.5   Hematocrit 36.0 - 46.0 % 35.4  37.8  37.1   Platelets 150 - 400 K/uL 205  193.0  201      CMP     Latest Ref Rng & Units 06/05/2022   12:26 AM 06/04/2022   11:16 PM 06/19/2021    9:33 AM  CMP  Glucose 70 - 99 mg/dL  621  308   BUN 6 - 20 mg/dL  14  10   Creatinine 6.57 - 1.00 mg/dL  8.46  9.62   Sodium 952 - 145 mmol/L  138  138   Potassium 3.5 - 5.1 mmol/L  3.0  4.3   Chloride 98 - 111 mmol/L  107  106   CO2 22 - 32 mmol/L  19  27   Calcium  8.9 - 10.3 mg/dL  8.9  8.8   Total Protein 6.5 - 8.1 g/dL 7.0   6.8   Total Bilirubin 0.3 - 1.2 mg/dL 0.6   0.6   Alkaline Phos 38 - 126 U/L 89   74   AST 15 - 41 U/L 21   12   ALT 0 - 44 U/L 19   12      Assessment: Encounter Diagnoses  Name Primary?   Pancreatic lesion Yes   Abdominal pain, epigastric      38 year old Hispanic female patient with intermittent epigastric pain not relieved with omeprazole  20 mg p.o. daily.  Her husband recently tested positive for H. Pylori, we will go ahead and order H. pylori Diatherix stool test.  Will start patient on famotidine twice daily.  Along with strict GERD diet.  If H. pylori test is negative and no improvements in symptoms [facility] Treadway consider endoscopy.      Incidental finding of cystic lesion in the tail of the pancreas on CT prior to last visit.  Dr. Venice Gillis ordered follow-up MRI/MRCP which showed stable 14 x 14 x 11 mm cystic lesion in the tail of the pancreas without discrete enhancing nodularity mural nodularity or pancreatic ductal dilation. Recommend follow up pre and post contrast MRI/MRCP in 1 year.  Plan: - Check CBC, CMP, lipase today - Strict GERD diet, no late meals 3-4 hours before lying down -Start Pepcid 20 mg 1  tablet before breakfast and dinner - Order H pylori Diatherix stool test -Repeat MRCP as suggested by radiology for pancreatic lesion   Thank you for the courtesy of this consult. Please call me with any questions or concerns.   Rechel Delosreyes, FNP-C [facility] 07/21/2023, 10:10 AM  Cc: Medicine, [facility]*

## 2023-07-21 NOTE — Patient Instructions (Addendum)
 Start Pepcid 20 mg 1 tablet before breakfast and dinner GERD diet, no late meals 3-4 hours before dinner  Your provider has requested that you go to the basement level for lab work before leaving today. Press "B" on the elevator. The lab is located at the first door on the left as you exit the elevator.  Your provider has ordered "Diatherix" stool testing for you. You have received a kit from our office today containing all necessary supplies to complete this test. Please carefully read the stool collection instructions provided in the kit before opening the accompanying materials. In addition, be sure there is a label providing your full name and date of birth on the "puritan opti-swab" tube that is supplied in the kit (if you do not see a label with this information on your test tube, please make us  aware before test collection!). After completing the test, you should secure the purtian tube into the specimen biohazard bag. The [facility] E-Req sheet (including date and time of specimen collection) should be placed into the outside pocket of the specimen biohazard bag and returned to the [facility] lab (basement floor of [facility]) within 3 days of collection. Please make sure to give the specimen to a staff member at the lab. DO NOT leave the specimen on the counter.   If the specimen date and time (can be found in the upper right boxed portion of the sheet) are not filled out on the E-Req sheet, the test will NOT be performed.   You have been scheduled for an MRI at [facility] on 07/25/23. Your appointment time is 7:00am. Please arrive to admitting (at main entrance of the hospital) 30 minutes prior to your appointment time for registration purposes. Please make certain not to have anything to eat or drink 4 hours prior to your test. In addition, if you have any metal in your body, have a pacemaker or defibrillator, please be sure to let your ordering physician  know. This test typically takes 45 minutes to 1 hour to complete. Should you need to reschedule, please call [phone] to do so.  _______________________________________________________  If your blood pressure at your visit was 140/90 or greater, please contact your primary care physician to follow up on this.  _______________________________________________________  If you are age 60 or older, your body mass index should be between 23-30. Your Body mass index is 32.3 kg/m. If this is out of the aforementioned range listed, please consider follow up with your Primary Care Provider.  If you are age 10 or younger, your body mass index should be between 19-25. Your Body mass index is 32.3 kg/m. If this is out of the aformentioned range listed, please consider follow up with your Primary Care Provider.   ________________________________________________________  The [facility] providers would like to encourage you to use MYCHART to communicate with providers for non-urgent requests or questions.  Due to long hold times on the telephone, sending your provider a message by [facility] may be a faster and more efficient way to get a response.  Please allow 48 business hours for a response.  Please remember that this is for non-urgent requests.  _______________________________________________________ Thank you for trusting me with your gastrointestinal care. Deanna May, RNP

## 2023-07-22 ENCOUNTER — Ambulatory Visit: Payer: Self-pay | Admitting: Gastroenterology

## 2023-07-24 ENCOUNTER — Other Ambulatory Visit (HOSPITAL_COMMUNITY)

## 2023-07-25 ENCOUNTER — Ambulatory Visit (HOSPITAL_COMMUNITY)
Admission: RE | Admit: 2023-07-25 | Discharge: 2023-07-25 | Disposition: A | Discharge status: Home / Self Care | Source: Ambulatory Visit | Attending: Gastroenterology | Admitting: Gastroenterology

## 2023-07-25 ENCOUNTER — Other Ambulatory Visit: Payer: Self-pay | Admitting: Gastroenterology

## 2023-07-25 DIAGNOSIS — K869 Disease of pancreas, unspecified: Secondary | ICD-10-CM

## 2023-07-25 MED ORDER — GADOBUTROL 1 MMOL/ML IV SOLN
8.0000 mL | Freq: Once | INTRAVENOUS | Status: AC | PRN
  Administered 2023-07-25: 8 mL via INTRAVENOUS

## 2023-07-28 ENCOUNTER — Ambulatory Visit: Payer: Self-pay | Admitting: Gastroenterology

## 2023-08-06 NOTE — Telephone Encounter (Signed)
-----   Message from [name] May sent at 07/28/2023  9:58 AM EDT ----- Erin Carney- patient has not read her mychart please tell her following:  I reviewed your lab work.  Your electrolytes are normal.  Your kidney function is normal.  Your liver function is normal.  Your hemoglobin is 13.9 which indicates no anemia.   The follow-up MRI  showed: 1.)  no change to the pancreatic lesion. No pancreatic ductal dilatation or surrounding inflammatory changes. 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 Recommend follow-up in 2 years with imaging or  or alternately EUS/FNA for definitive diagnosis.  2.) shows fatty liver.  3.) Multiple fluid signal cystic lesions throughout the breasts, most consistent with benign ductal cysts. Correlate with mammography. Would recommend she discuss with her OB/GYN if she has not had mammogram. [name], NP

## 2023-08-11 ENCOUNTER — Other Ambulatory Visit: Payer: Self-pay | Admitting: Internal Medicine

## 2023-08-11 DIAGNOSIS — N6001 Solitary cyst of right breast: Secondary | ICD-10-CM

## 2023-08-13 ENCOUNTER — Telehealth: Payer: Self-pay

## 2023-08-13 NOTE — Telephone Encounter (Signed)
 Reminder received in Epic.  Chart reviewed and noted that pt had MRI completed on 07/25/2023.

## 2023-08-13 NOTE — Telephone Encounter (Signed)
-----   Message from Nurse Archer Kobs C sent at 08/12/2022 10:35 AM EDT ----- 1 year MRCP recall. Order needs to be placed. Refer to imaging result note 07/31/22.

## 2023-08-20 ENCOUNTER — Other Ambulatory Visit: Payer: Self-pay | Admitting: Internal Medicine

## 2023-08-20 DIAGNOSIS — N6002 Solitary cyst of left breast: Secondary | ICD-10-CM

## 2023-09-02 ENCOUNTER — Ambulatory Visit
Admission: RE | Admit: 2023-09-02 | Discharge: 2023-09-02 | Discharge status: Home / Self Care | Source: Ambulatory Visit | Attending: Internal Medicine | Admitting: Internal Medicine

## 2023-09-02 ENCOUNTER — Ambulatory Visit

## 2023-09-02 ENCOUNTER — Ambulatory Visit
Admission: RE | Admit: 2023-09-02 | Discharge: 2023-09-02 | Disposition: A | Discharge status: Home / Self Care | Source: Ambulatory Visit | Attending: Internal Medicine | Admitting: Internal Medicine

## 2023-09-02 DIAGNOSIS — N6002 Solitary cyst of left breast: Secondary | ICD-10-CM

## 2023-09-09 ENCOUNTER — Ambulatory Visit (HOSPITAL_COMMUNITY): Admission: EM | Admit: 2023-09-09 | Discharge: 2023-09-09 | Disposition: A | Discharge status: Home / Self Care

## 2023-09-09 ENCOUNTER — Encounter (HOSPITAL_COMMUNITY): Payer: Self-pay

## 2023-09-09 DIAGNOSIS — H60502 Unspecified acute noninfective otitis externa, left ear: Secondary | ICD-10-CM | POA: Diagnosis not present

## 2023-09-09 DIAGNOSIS — R0981 Nasal congestion: Secondary | ICD-10-CM | POA: Diagnosis not present

## 2023-09-09 DIAGNOSIS — H6122 Impacted cerumen, left ear: Secondary | ICD-10-CM | POA: Diagnosis not present

## 2023-09-09 MED ORDER — CETIRIZINE HCL 10 MG PO TABS: 10.0000 mg | ORAL_TABLET | Freq: Every day | ORAL | 0 refills | Status: AC

## 2023-09-09 MED ORDER — NEOMYCIN-POLYMYXIN-HC 1 % OT SOLN: 3.0000 [drp] | Freq: Three times a day (TID) | OTIC | 0 refills | Status: AC

## 2023-09-09 NOTE — Discharge Instructions (Addendum)
 Apply Cortisporin eardrops 3 drops every 8 hours for 5 days. Take cetirizine once daily to help with congestion and runny nose. Follow-up with Dr. Karis who is an ear nose and throat specialist for further evaluation and management of this. Follow-up with your primary care provider or return here as needed.  Aplique 3 gotas ticas de cortisporina cada 8 horas durante 5 das. Tome cetirizina una vez al da para aliviar la congestin y el goteo nasal. Consulte con el Dr. Karis, especialista en otorrinolaringologa, para ignacia evaluacin y tratamiento adicionales. Consulte con su mdico de cabecera o regrese aqu segn sea necesario.

## 2023-09-09 NOTE — ED Provider Notes (Signed)
 [facility]    CSN: [id number] Arrival date & time: 09/09/23  1256      History   Chief Complaint Chief Complaint  Patient presents with   Otalgia    HPI Erin Carney is a 38 y.o. female.   Patient presents with left ear itching, congestion, and runny nose x 8 months.  Patient states that she has been seen for this previously and was given Cortisporin eardrops on 6/17.  Patient states that she ran out of these and continues to have itching and pain to her left ear.  Patient states that she also continues to have congestion and runny nose.  Denies fever, sore throat, headache, cough, chest congestion, chest pain, shortness of breath, nausea, vomiting, diarrhea, dizziness, and weakness.  The history is provided by the patient and medical records. The history is limited by a language barrier. A language interpreter was used (Spanish interpreter).  Otalgia   Past Medical History:  Diagnosis Date   Medical history non-contributory     Patient Active Problem List   Diagnosis Date Noted   Mass of pancreas 10/28/2020   LLQ abdominal pain 10/28/2020    Past Surgical History:  Procedure Laterality Date   NO PAST SURGERIES      OB History     Gravida  6   Para  4   Term  3   Preterm  1   AB  2   Living  4      SAB  2   IAB      Ectopic      Multiple  0   Live Births  4            Home Medications    Prior to Admission medications   Medication Sig Start Date End Date Taking? Authorizing Provider  cetirizine (ZYRTEC ALLERGY) 10 MG tablet Take 1 tablet (10 mg total) by mouth daily. 09/09/23  Yes Johnie Flaming A, NP  famotidine  (PEPCID ) 20 MG tablet Take 1 tablet (20 mg total) by mouth 2 (two) times daily. 07/21/23   May, Deanna J, NP  NEOMYCIN-POLYMYXIN-HYDROCORTISONE (CORTISPORIN) 1 % SOLN OTIC solution Place 3 drops into the left ear every 8 (eight) hours for 5 days. 09/09/23 09/14/23  Johnie Flaming LABOR, NP    Family  History Family History  Problem Relation Age of Onset   Alcohol abuse Neg Hx    Arthritis Neg Hx    Asthma Neg Hx    Birth defects Neg Hx    Cancer Neg Hx    COPD Neg Hx    Depression Neg Hx    Diabetes Neg Hx    Drug abuse Neg Hx    Early death Neg Hx    Hearing loss Neg Hx    Heart disease Neg Hx    Hyperlipidemia Neg Hx    Kidney disease Neg Hx    Learning disabilities Neg Hx    Mental illness Neg Hx    Mental retardation Neg Hx    Miscarriages / Stillbirths Neg Hx    Stroke Neg Hx    Vision loss Neg Hx    Varicose Veins Neg Hx    Pancreatic cancer Neg Hx    Colon cancer Neg Hx    Hypertension Neg Hx    Rectal cancer Neg Hx    Stomach cancer Neg Hx     Social History Social History   Tobacco Use   Smoking status: Never   Smokeless tobacco: Never  Vaping Use   Vaping status: Never Used  Substance Use Topics   Alcohol use: No   Drug use: No     Allergies   Patient has no known allergies.   Review of Systems Review of Systems  HENT:  Positive for ear pain.    Per HPI  Physical Exam Triage Vital Signs ED Triage Vitals  Encounter Vitals Group     BP 09/09/23 1328 (!) 93/57     Girls Systolic BP Percentile --      Girls Diastolic BP Percentile --      Boys Systolic BP Percentile --      Boys Diastolic BP Percentile --      Pulse Rate 09/09/23 1328 69     Resp 09/09/23 1328 18     Temp 09/09/23 1328 98 F (36.7 C)     Temp Source 09/09/23 1328 Oral     SpO2 09/09/23 1328 97 %     Weight --      Height --      Head Circumference --      Peak Flow --      Pain Score 09/09/23 1329 0     Pain Loc --      Pain Education --      Exclude from Growth Chart --    No data found.  Updated Vital Signs BP (!) 93/57 (BP Location: Right Arm)   Pulse 69   Temp 98 F (36.7 C) (Oral)   Resp 18   LMP 08/26/2023 (Exact Date)   SpO2 97%   Visual Acuity Right Eye Distance:   Left Eye Distance:   Bilateral Distance:    Right Eye Near:   Left Eye  Near:    Bilateral Near:     Physical Exam Vitals and nursing note reviewed.  Constitutional:      General: She is awake. She is not in acute distress.    Appearance: Normal appearance. She is well-developed and well-groomed. She is not ill-appearing.  HENT:     Right Ear: Tympanic membrane, ear canal and external ear normal.     Left Ear: Tympanic membrane normal. Drainage and swelling present.     Ears:     Comments: Mild amount of drainage and cerumen noted to the left ear canal that is blocking visual of the TM.  After ear lavage was performed there is mild swelling and erythema noted to the left ear canal.  TM is intact without erythema or bulging    Nose: Congestion and rhinorrhea present.     Mouth/Throat:     Mouth: Mucous membranes are moist.     Pharynx: Oropharynx is clear.   Cardiovascular:     Rate and Rhythm: Normal rate and regular rhythm.  Pulmonary:     Effort: Pulmonary effort is normal.     Breath sounds: Normal breath sounds.   Skin:    General: Skin is warm and dry.   Neurological:     Mental Status: She is alert.   Psychiatric:        Behavior: Behavior is cooperative.      UC Treatments / Results  Labs (all labs ordered are listed, but only abnormal results are displayed) Labs Reviewed - No data to display  EKG   Radiology No results found.  Procedures Procedures (including critical care time)  Medications Ordered in UC Medications - No data to display  Initial Impression / Assessment and Plan / UC Course  I have  reviewed the triage vital signs and the nursing notes.  Pertinent labs & imaging results that were available during my care of the patient were reviewed by me and considered in my medical decision making (see chart for details).     Patient is overall well-appearing.  Vitals are stable.  There is a mild amount of drainage and cerumen noted to the left ear canal that is blocking visual of the TM.  After ear lavage was  performed there was mild swelling and erythema noted to the left ear canal.  TM is intact without erythema or bulging.  Refilled Cortisporin eardrops recommended patient use these last frequently than previously prescribed.  Prescribed cetirizine to help with congestion and runny nose.  Given ENT follow-up.  Discussed follow-up and return precautions. Final Clinical Impressions(s) / UC Diagnoses   Final diagnoses:  Impacted cerumen of left ear  Acute otitis externa of left ear, unspecified type  Nasal congestion     Discharge Instructions      Apply Cortisporin eardrops 3 drops every 8 hours for 5 days. Take cetirizine once daily to help with congestion and runny nose. Follow-up with Dr. Karis who is an ear nose and throat specialist for further evaluation and management of this. Follow-up with your primary care provider or return here as needed.  Aplique 3 gotas ticas de cortisporina cada 8 horas durante 5 das. Tome cetirizina una vez al da para aliviar la congestin y el goteo nasal. Consulte con el Dr. Karis, especialista en otorrinolaringologa, para ignacia evaluacin y tratamiento adicionales. Consulte con su mdico de cabecera o regrese aqu segn sea necesario.    ED Prescriptions     Medication Sig Dispense Auth. Provider   NEOMYCIN-POLYMYXIN-HYDROCORTISONE (CORTISPORIN) 1 % SOLN OTIC solution Place 3 drops into the left ear every 8 (eight) hours for 5 days. 10 mL Johnie Flaming A, NP   cetirizine (ZYRTEC ALLERGY) 10 MG tablet Take 1 tablet (10 mg total) by mouth daily. 30 tablet Johnie Flaming A, NP      PDMP not reviewed this encounter.   Johnie Flaming A, NP 09/09/23 1535

## 2023-09-09 NOTE — ED Triage Notes (Signed)
 Pt c/o lt ear itching and runny nose for 8 months. States being tx'd for allergies. States was given ear drops on 6/17 and having more itching to lt ear.

## 2023-09-16 ENCOUNTER — Telehealth: Payer: Self-pay | Admitting: Gastroenterology

## 2023-09-16 ENCOUNTER — Emergency Department (HOSPITAL_COMMUNITY)
Admission: EM | Admit: 2023-09-16 | Discharge: 2023-09-17 | Disposition: A | Discharge status: Home / Self Care | Attending: Emergency Medicine | Admitting: Emergency Medicine

## 2023-09-16 DIAGNOSIS — R1084 Generalized abdominal pain: Secondary | ICD-10-CM | POA: Diagnosis present

## 2023-09-16 DIAGNOSIS — K2950 Unspecified chronic gastritis without bleeding: Secondary | ICD-10-CM | POA: Insufficient documentation

## 2023-09-16 LAB — CBC WITH DIFFERENTIAL/PLATELET
Abs Immature Granulocytes: 0.02 K/uL (ref 0.00–0.07)
Basophils Absolute: 0 K/uL (ref 0.0–0.1)
Basophils Relative: 0 %
Eosinophils Absolute: 0.1 K/uL (ref 0.0–0.5)
Eosinophils Relative: 1 %
HCT: 40.4 % (ref 36.0–46.0)
Hemoglobin: 13.8 g/dL (ref 12.0–15.0)
Immature Granulocytes: 0 %
Lymphocytes Relative: 34 %
Lymphs Abs: 1.7 K/uL (ref 0.7–4.0)
MCH: 31.3 pg (ref 26.0–34.0)
MCHC: 34.2 g/dL (ref 30.0–36.0)
MCV: 91.6 fL (ref 80.0–100.0)
Monocytes Absolute: 0.4 K/uL (ref 0.1–1.0)
Monocytes Relative: 8 %
Neutro Abs: 2.8 K/uL (ref 1.7–7.7)
Neutrophils Relative %: 57 %
Platelets: 229 K/uL (ref 150–400)
RBC: 4.41 MIL/uL (ref 3.87–5.11)
RDW: 12.4 % (ref 11.5–15.5)
WBC: 5 K/uL (ref 4.0–10.5)
nRBC: 0 % (ref 0.0–0.2)

## 2023-09-16 LAB — URINALYSIS, ROUTINE W REFLEX MICROSCOPIC
Bilirubin Urine: NEGATIVE
Glucose, UA: NEGATIVE mg/dL
Hgb urine dipstick: NEGATIVE
Ketones, ur: NEGATIVE mg/dL
Leukocytes,Ua: NEGATIVE
Nitrite: NEGATIVE
Protein, ur: NEGATIVE mg/dL
Specific Gravity, Urine: 1.02 (ref 1.005–1.030)
pH: 6 (ref 5.0–8.0)

## 2023-09-16 LAB — COMPREHENSIVE METABOLIC PANEL WITH GFR
ALT: 21 U/L (ref 0–44)
AST: 20 U/L (ref 15–41)
Albumin: 4 g/dL (ref 3.5–5.0)
Alkaline Phosphatase: 59 U/L (ref 38–126)
Anion gap: 9 (ref 5–15)
BUN: 11 mg/dL (ref 6–20)
CO2: 25 mmol/L (ref 22–32)
Calcium: 9.3 mg/dL (ref 8.9–10.3)
Chloride: 103 mmol/L (ref 98–111)
Creatinine, Ser: 0.63 mg/dL (ref 0.44–1.00)
GFR, Estimated: >60 mL/min (ref >60)
Glucose, Bld: 100 mg/dL — ABNORMAL HIGH (ref 70–99)
Potassium: 3.6 mmol/L (ref 3.5–5.1)
Sodium: 137 mmol/L (ref 135–145)
Total Bilirubin: 0.7 mg/dL (ref 0.0–1.2)
Total Protein: 7.5 g/dL (ref 6.5–8.1)

## 2023-09-16 LAB — HCG, SERUM, QUALITATIVE: Preg, Serum: NEGATIVE

## 2023-09-16 LAB — LIPASE, BLOOD: Lipase: 38 U/L (ref 11–51)

## 2023-09-16 NOTE — Telephone Encounter (Signed)
 Inbound call from patient, states she is having severe acid reflux and burning sensation. She states Deanna advised if her symptoms continued that she would recommend an EGD. Patient would like to know if she can proceed in scheduling. Please advise.

## 2023-09-16 NOTE — ED Provider Triage Note (Signed)
 Emergency Medicine Provider Triage Evaluation Note  Erin Carney , a 38 y.o. female  was evaluated in triage.  Pt complains of abdominal pain. Reports symptoms since Saturday. Pain is burning, in the epigastric region.  Worse with food.  Reports history of similar but not as painful.  No history of abdominal surgeries.  Review of Systems  Positive:  Negative:   Physical Exam  BP 99/69 (BP Location: Left Arm)   Pulse 69   Temp 98.4 F (36.9 C)   Resp 16   Ht 5' 1 (1.549 m)   Wt 84.8 kg   LMP 08/26/2023 (Exact Date)   SpO2 96%   BMI 35.33 kg/m  Gen:   Awake, no distress   Resp:  Normal effort  MSK:   Moves extremities without difficulty  Other:    Medical Decision Making  Medically screening exam initiated at 5:04 PM.  Appropriate orders placed.  Erin Carney was informed that the remainder of the evaluation will be completed by another provider, this initial triage assessment does not replace that evaluation, and the importance of remaining in the ED until their evaluation is complete.     Nora Lauraine LABOR, PA-C 09/16/23 [phone]

## 2023-09-16 NOTE — ED Triage Notes (Addendum)
 [place] Spanish interpretor used to assist with triage. Pt reports generalized  abdominal pain /burning and lower back pain x 3 days. Pt denies urinary symptoms. Last BM today  . Reports history of the same.

## 2023-09-17 MED ORDER — ONDANSETRON 4 MG PO TBDP
4.0000 mg | ORAL_TABLET | Freq: Once | ORAL | Status: AC
  Administered 2023-09-17: 4 mg via ORAL
  Filled 2023-09-17: qty 1

## 2023-09-17 MED ORDER — LIDOCAINE VISCOUS HCL 2 % MT SOLN
15.0000 mL | Freq: Once | OROMUCOSAL | Status: AC
  Administered 2023-09-17: 15 mL via ORAL
  Filled 2023-09-17: qty 15

## 2023-09-17 MED ORDER — PANTOPRAZOLE SODIUM 20 MG PO TBEC: 20.0000 mg | DELAYED_RELEASE_TABLET | Freq: Every day | ORAL | 0 refills | Status: DC

## 2023-09-17 MED ORDER — ALUM & MAG HYDROXIDE-SIMETH 200-200-20 MG/5ML PO SUSP
30.0000 mL | Freq: Once | ORAL | Status: AC
  Administered 2023-09-17: 30 mL via ORAL
  Filled 2023-09-17: qty 30

## 2023-09-17 NOTE — Telephone Encounter (Signed)
 Spoke with pt using a spanish interpreter. Pt stated that she is continuing to have severe heartburn and that she had to go to the ED yesterday. Pt is requesting  to have an EGD.  Chart reviewed and noted that pt had recent office visit.  Please review and advise.

## 2023-09-17 NOTE — ED Provider Notes (Signed)
 [place] EMERGENCY DEPARTMENT AT [facility] Provider Note   CSN: [id number] Arrival date & time: 09/16/23  1536     Patient presents with: Abdominal Pain (Burning abdominal pain x 3 days, nothing makes it better or worse.)   Erin Carney is a 38 y.o. female.  Patient with past history significant for gastritis presents to the emergency department concerns of abdominal pain.  She describes this pain as generalized in nature and a burning type sensation that has been present for the last 3 days.  Reports history of GERD and was previously on PPIs but was discontinued several weeks ago and switched to famotidine .  She states that since the switch, has had worsening symptoms of gastritis that she feels may be acutely worse in the last several days after eating pineapple.  States that she has minimal caffeine intake and tries to avoid aggravating foods.  She currently follows with GI but has not been seen recently.  No recent EGD or colonoscopy.  Denies any hematemesis, hematochezia, or melanotic stools.    Abdominal Pain      Prior to Admission medications   Medication Sig Start Date End Date Taking? Authorizing Provider  pantoprazole  (PROTONIX ) 20 MG tablet Take 1 tablet (20 mg total) by mouth daily. 09/17/23  Yes Arielys Wandersee A, PA-C  cetirizine  (ZYRTEC  ALLERGY) 10 MG tablet Take 1 tablet (10 mg total) by mouth daily. 09/09/23   Johnie Flaming A, NP  famotidine  (PEPCID ) 20 MG tablet Take 1 tablet (20 mg total) by mouth 2 (two) times daily. 07/21/23   May, Deanna J, NP    Allergies: Patient has no known allergies.    Review of Systems  Gastrointestinal:  Positive for abdominal pain.  All other systems reviewed and are negative.   Updated Vital Signs BP 95/60   Pulse 67   Temp 98.1 F (36.7 C) (Oral)   Resp 18   Ht 5' 1 (1.549 m)   Wt 84.8 kg   LMP 08/26/2023 (Exact Date)   SpO2 99%   BMI 35.33 kg/m   Physical Exam Vitals and nursing note reviewed.   Constitutional:      General: She is not in acute distress.    Appearance: She is well-developed.  HENT:     Head: Normocephalic and atraumatic.  Eyes:     Conjunctiva/sclera: Conjunctivae normal.  Cardiovascular:     Rate and Rhythm: Normal rate and regular rhythm.     Heart sounds: No murmur heard. Pulmonary:     Effort: Pulmonary effort is normal. No respiratory distress.     Breath sounds: Normal breath sounds.  Abdominal:     Palpations: Abdomen is soft.     Tenderness: There is generalized abdominal tenderness. There is no guarding. Negative signs include Murphy's sign and McBurney's sign.  Musculoskeletal:        General: No swelling.     Cervical back: Neck supple.  Skin:    General: Skin is warm and dry.     Capillary Refill: Capillary refill takes less than 2 seconds.  Neurological:     Mental Status: She is alert.  Psychiatric:        Mood and Affect: Mood normal.     (all labs ordered are listed, but only abnormal results are displayed) Labs Reviewed  COMPREHENSIVE METABOLIC PANEL WITH GFR - Abnormal; Notable for the following components:      Result Value   Glucose, Bld 100 (*)    All other components  within normal limits  LIPASE, BLOOD  CBC WITH DIFFERENTIAL/PLATELET  URINALYSIS, ROUTINE W REFLEX MICROSCOPIC  HCG, SERUM, QUALITATIVE    EKG: None  Radiology: No results found.   Procedures   Medications Ordered in the ED  alum & mag hydroxide-simeth (MAALOX/MYLANTA) 200-200-20 MG/5ML suspension 30 mL (30 mLs Oral Given 09/17/23 0213)    And  lidocaine  (XYLOCAINE ) 2 % viscous mouth solution 15 mL (15 mLs Oral Given 09/17/23 0213)  ondansetron  (ZOFRAN -ODT) disintegrating tablet 4 mg (4 mg Oral Given 09/17/23 0213)                                    Medical Decision Making Risk OTC drugs. Prescription drug management.   This patient presents to the ED for concern of abdominal pain.  Differential diagnosis includes gastritis, gastritis, bowel  obstruction, appendicitis, diverticulitis, pancreatitis   Lab Tests:  I Ordered, and personally interpreted labs.  The pertinent results include: CBC unremarkable, CMP unremarkable, urinalysis negative, hCG negative, lipase unremarkable 38   Medicines ordered and prescription drug management:  I ordered medication including Zofran , Maalox, viscous lidocaine  for nausea,. Reevaluation of the patient after these medicines showed that the patient improved I have reviewed the patients home medicines and have made adjustments as needed   Problem List / ED Course:  Patient with past history significant for gastritis presented to the emergency department with concern of abdominal pain.  On exam, patient has generalized abdominal pain but no focal guarding or tenderness.  Denies any nausea, vomiting, diarrhea.  No obvious signs of systemic illness. Given patient has had prior CT and MR imaging in the last several months, do not feel that patient require more emergent evaluation given reassuring lab workup.  Will try course of Maalox, viscous lidocaine , and Zofran  for symptom control.  Lab work is thankfully reassuring no evident leukocytosis or signs of dehydration. On repeat evaluation after GI cocktail, patient reports significant improvement in symptoms.  Suspect ongoing issues with patient's poorly controlled chronic gastritis.  Will restart patient on PPI with Protonix  20 mg daily.  Discussed follow-up with PCP and GI.  Return precautions discussed also concerns for new or worsening symptoms.  Otherwise stable at this time for outpatient follow-up and discharged home.  Final diagnoses:  Chronic gastritis, presence of bleeding unspecified, unspecified gastritis type    ED Discharge Orders          Ordered    pantoprazole  (PROTONIX ) 20 MG tablet  Daily        09/17/23 0337               Cecily Legrand LABOR, PA-C 09/17/23 9350    Long, Joshua G, MD 09/18/23 [phone]

## 2023-09-17 NOTE — Discharge Instructions (Signed)
 Hoy lo atendieron en urgencias por dolor abdominal y ardor. Afortunadamente, sus anlisis fueron tranquilizadores y mejor con un cctel gastrointestinal. Sospecho que sus sntomas se deben a gastritis. Le he iniciado un tratamiento con pantoprazol durante las prximas semanas. Por favor, consulte con su gastroenterlogo para una evaluacin ms detallada. Regrese a urgencias si presenta sntomas nuevos o que empeoran.  You were seen in the emergency department today for concerns of abdominal pain and a burning sensation.  Your labs were thankfully reassuring and you had improvement with a GI cocktail.  I suspect your symptoms are due to gastritis.  I have started you on a course of pantoprazole  for the next several weeks.  Please follow-up with your gastroenterologist for further evaluation.  Return to the emergency department for any concerns of new or worsening symptoms.

## 2023-09-18 ENCOUNTER — Other Ambulatory Visit: Payer: Self-pay

## 2023-09-18 DIAGNOSIS — K219 Gastro-esophageal reflux disease without esophagitis: Secondary | ICD-10-CM

## 2023-09-18 DIAGNOSIS — R1013 Epigastric pain: Secondary | ICD-10-CM

## 2023-09-18 MED ORDER — OMEPRAZOLE 40 MG PO CPDR: 40.0000 mg | DELAYED_RELEASE_CAPSULE | Freq: Every day | ORAL | 0 refills | Status: AC

## 2023-09-18 NOTE — Telephone Encounter (Signed)
 Spoke with patient using spanish interpreter. H. Pylori test returned negative on 08/12/23 (mychart message). Prescription sent to pharmacy. EGD scheduled for 11/18/23 at 9:00 am in the [facility] with Dr. Charlanne. Amb ref placed & instructions sent to mychart. Denies any blood thinners/diabetic medication. Advised she call back sooner than procedure date if symptoms worsen or has questions/concerns. Pt verbalized all understanding & had no further questions at end of call.

## 2023-10-17 ENCOUNTER — Encounter: Payer: Self-pay | Admitting: Gastroenterology

## 2023-10-29 ENCOUNTER — Ambulatory Visit: Admitting: Gastroenterology

## 2023-11-18 ENCOUNTER — Encounter: Payer: Self-pay | Admitting: Gastroenterology

## 2023-11-18 ENCOUNTER — Ambulatory Visit: Admitting: Gastroenterology

## 2023-11-18 VITALS — BP 84/60 | HR 62 | Temp 98.0°F | Resp 17 | Ht 62.25 in | Wt 178.0 lb

## 2023-11-18 DIAGNOSIS — K219 Gastro-esophageal reflux disease without esophagitis: Secondary | ICD-10-CM

## 2023-11-18 DIAGNOSIS — K2950 Unspecified chronic gastritis without bleeding: Secondary | ICD-10-CM | POA: Diagnosis not present

## 2023-11-18 DIAGNOSIS — R1013 Epigastric pain: Secondary | ICD-10-CM

## 2023-11-18 DIAGNOSIS — Z3202 Encounter for pregnancy test, result negative: Secondary | ICD-10-CM

## 2023-11-18 DIAGNOSIS — K317 Polyp of stomach and duodenum: Secondary | ICD-10-CM

## 2023-11-18 LAB — POCT URINE PREGNANCY: Preg Test, Ur: NEGATIVE

## 2023-11-18 MED ORDER — SODIUM CHLORIDE 0.9 % IV SOLN: 500.0000 mL | Freq: Once | INTRAVENOUS | Status: DC

## 2023-11-18 NOTE — Progress Notes (Signed)
 Called to room to assist during endoscopic procedure.  Patient ID and intended procedure confirmed with present staff. Received instructions for my participation in the procedure from the performing physician.

## 2023-11-18 NOTE — Op Note (Signed)
 [facility] Patient Name: Erin Carney Procedure Date: 11/18/2023 9:43 AM MRN: [id number] Endoscopist: Lynnie Bring , MD, [id number] Age: 38 Referring MD:  Date of Birth: [birth year] Gender: Female Account #: [id number] Procedure:                Upper GI endoscopy Indications:              Epigastric abdominal pain Medicines:                Monitored Anesthesia Care Procedure:                Pre-Anesthesia Assessment:                           - Prior to the procedure, a History and Physical                            was performed, and patient medications and                            allergies were reviewed. The patient's tolerance of                            previous anesthesia was also reviewed. The risks                            and benefits of the procedure and the sedation                            options and risks were discussed with the patient.                            All questions were answered, and informed consent                            was obtained. Prior Anticoagulants: The patient has                            taken no anticoagulant or antiplatelet agents. ASA                            Grade Assessment: II - A patient with mild systemic                            disease. After reviewing the risks and benefits,                            the patient was deemed in satisfactory condition to                            undergo the procedure.                           After obtaining informed consent, the endoscope was  passed under direct vision. Throughout the                            procedure, the patient's blood pressure, pulse, and                            oxygen saturations were monitored continuously. The                            GIF [id number] #[id number] was introduced through the                            mouth, and advanced to the second part of duodenum.                            The upper GI endoscopy  was accomplished without                            difficulty. The patient tolerated the procedure                            well. Scope In: Scope Out: Findings:                 The examined esophagus was normal.                           The Z-line was regular and was found 35 cm from the                            incisors.                           The entire examined stomach was normal. Biopsies                            were taken with a cold forceps for histology.                           A few (8-10) 2 to 3 mm sessile polyps with no                            bleeding and no stigmata of recent bleeding were                            found in the gastric body. Three polyps were                            removed with a cold biopsy forceps. Resection and                            retrieval were complete.                           The examined duodenum was  normal. Biopsies for                            histology were taken with a cold forceps for                            evaluation of celiac disease. Complications:            No immediate complications. Estimated Blood Loss:     Estimated blood loss: none. Impression:               - A few gastric polyps. Resected and retrieved x 3.                           - Otherwise normal EGD. Recommendation:           - Patient has a contact number available for                            emergencies. The signs and symptoms of potential                            delayed complications were discussed with the                            patient. Return to normal activities tomorrow.                            Written discharge instructions were provided to the                            patient.                           - Resume previous diet.                           - Continue present medications. Can reduce                            omeprazole  to once a day.                           - Await pathology results.                            - If still with problems, further workup.                           - The findings and recommendations were discussed                            with the patient's family. Lynnie Bring, MD 11/18/2023 10:00:27 AM This report has been signed electronically.

## 2023-11-18 NOTE — Patient Instructions (Addendum)
 Reanudar la dieta anterior  Continuar con la medicacin actual  [employer] de patologa   USTED TUVO UN PROCEDIMIENTO ENDOSCPICO HOY EN EL [facility]:   Lea el informe del procedimiento que se le entreg para cualquier pregunta especfica sobre lo que se encontr durante su examen.  Si el informe del examen no responde a sus preguntas, por favor llame a su gastroenterlogo para aclararlo.  Si usted solicit que no se le den [employer] de lo que se Clinical cytogeneticist en su procedimiento al [employer] va a cuidar, entonces el informe del procedimiento se ha incluido en un sobre sellado para que usted lo revise despus cuando le sea ms conveniente.   LO QUE PUEDE ESPERAR: Algunas sensaciones de hinchazn en el abdomen.  Puede tener ms gases de lo normal.  El caminar puede ayudarle a eliminar el aire que se le puso en el tracto gastrointestinal durante el procedimiento y reducir la hinchazn.  Si le hicieron una endoscopia inferior (como una colonoscopia o una sigmoidoscopia flexible), podra notar manchas de sangre en las heces fecales o en el papel higinico.  Si se someti a una preparacin intestinal para su procedimiento, es posible que no tenga una evacuacin intestinal normal durante [employer].   Tenga en cuenta:  Es posible que note un poco de irritacin y congestin en la nariz o algn drenaje.  Esto es debido al oxgeno [employer] durante su procedimiento.  No hay que preocuparse y esto debe desaparecer ms o Regulatory affairs officer.     Despus de la endoscopia superior (EGD)  Vmitos de Retail buyer o material como caf molido   Dolor en el pecho o dolor debajo de los omplatos que antes no tena   Dolor o dificultad persistente para tragar  Falta de aire que antes no tena   Fiebre de 100F o ms  Heces fecales negras y pegajosas   Para asuntos urgentes o de Associate Professor, puede comunicarse con un gastroenterlogo a cualquier hora llamando al [phone].  DIETA:   Recomendamos una comida pequea al principio, pero luego puede continuar con su dieta normal.  Tome muchos lquidos, pero debe evitar las bebidas alcohlicas durante 24 horas.    ACTIVIDAD:  Debe planear tomarse las cosas con calma por el resto del da y no debe CONDUCIR ni usar maquinaria pesada Patent examiner (debido a los medicamentos de sedacin utilizados durante el examen).     SEGUIMIENTO: Nuestro personal llamar al nmero que aparece en su historial al siguiente da hbil de su procedimiento para ver cmo se siente y para responder cualquier pregunta o inquietud que pueda tener con respecto a la informacin que se le dio despus del procedimiento. Si no podemos contactarle, le dejaremos un mensaje.  Sin embargo, si se siente bien y no tiene English as a second language teacher, no es necesario que nos devuelva la llamada.  Asumiremos que ha regresado a sus actividades diarias normales sin incidentes. Si se le tomaron algunas biopsias, le contactaremos por telfono o por carta en las prximas 3 semanas.  Si no ha sabido [employer] biopsias en el transcurso de 3 semanas, por favor llmenos al [phone].   FIRMAS/CONFIDENCIALIDAD: Usted y/o el acompaante que le cuide han firmado documentos que se ingresarn en su historial mdico electrnico.  Estas firmas atestiguan el hecho de que la informacin anterior

## 2023-11-18 NOTE — Progress Notes (Signed)
 Report to PACU, RN, vss, BBS= Clear.

## 2023-11-18 NOTE — Progress Notes (Signed)
 Chief Complaint:epigastric pain Primary GI Doctor: Dr. Charlanne    HPI:  Patient is a  38  year old Hispanic female patient, known to Dr. Charlanne, with past medical history of GERD, who presents for a complaint of epigastric pain .   Last seen by Dr. Charlanne on 06/19/21 for epigastric pain. Started on PPI Omeprazole  20 mg po daily.  Incidental asymptomatic TOP (tail of pancreas) 1.9 cm cystic lesion. No red flag signs or symptoms. No H/O pancreatitis, trauma. Nl PD, no intramural nodules. Nl CA 19-9 10/2020. Ordered MRI with MRCP-Stable septated 14 x 14 x 11 mm cystic lesion in the tail of the pancreas without discrete enhancing nodularity mural nodularity or pancreatic ductal dilation. CBC, CMP, lipase, CA19-9 labs show- lipase 21, CA 19-9 11, CBC and CMP all at baseline.   Previous GI work-up:   CT AP 10/2020 -No acute intra-abdominal pathology identified. No definite radiographic explanation for the patient's reported symptoms.   -19 mm indeterminate low-attenuation lesion within the tail the pancreas. This would be better assessed with EUS or pancreatic protocol MRI examination.   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 Clinical correlation for signs and symptoms of pelvic venous congestion Carney be helpful. Interventional radiology consultation Carney also be helpful for further management.   MRI 10/2020 1. Minimally septated pancreatic tail cystic lesion of 1.7 cm. Differential considerations include pseudocyst or indolent cystic neoplasm. Per consensus criteria, clinical strategies include repeat MRI/MRCP at 6 months versus further evaluation with endoscopic ultrasound. This recommendation follows ACR consensus guidelines: Management of Incidental Pancreatic Cysts: A White Paper of the ACR Incidental Findings Committee. J Am Coll Radiol 2017;14:911-923. 2.  No acute abdominal process. 3. Mild  hepatic steatosis   MRCP 07/31/22 IMPRESSION: 1. Stable septated 14 x 14 x 11 mm cystic lesion in the tail of the pancreas without discrete enhancing nodularity mural nodularity or pancreatic ductal dilation. Recommend follow up pre and post contrast MRI/MRCP in 1 year. 2. Mild diffuse hepatic steatosis.     Due to language barrier, an interpreter was present during the history-taking and subsequent discussion (and for part of the physical exam) with this patient.    Interval History    Patient presents with main complaint of epigastric pain that started about 2 weeks ago. She states the Omeprazole  20 mg po daily did not help with current symptoms. She states Aloe helps with the discomfort. She has pain with and without eating. She does tell me her husband tested positive for H pylori and treated. Her kids were negative when  tested. She has not been tested.  No constipation or diarrhea. No nausea, vomiting, or weight loss. No tarry stools or blood in stool. No recent travel. No exposure. Nonsmoker. No alcohol use.  No NSAID's. No family history of stomach CA or colon CA. Denies/Admits NSAID use.      Wt Readings from Last 3 Encounters:  07/21/23 178 lb (80.7 kg)  09/12/21 181 lb (82.1 kg)  06/19/21 181 lb 2 oz (82.2 kg)        Past Medical History:  Diagnosis Date   Medical history non-contributory               Past Surgical History:  Procedure Laterality Date   NO PAST SURGERIES              No current outpatient medications on file.  No current facility-administered medications for this visit.           Allergies as of 07/21/2023   (No Known Allergies)           Family History  Problem Relation Age of Onset   Alcohol abuse Neg Hx     Arthritis Neg Hx     Asthma Neg Hx     Birth defects Neg Hx     Cancer Neg Hx     COPD Neg Hx     Depression Neg Hx     Diabetes Neg Hx     Drug abuse Neg Hx     Early death Neg Hx     Hearing loss Neg Hx      Heart disease Neg Hx     Hyperlipidemia Neg Hx     Kidney disease Neg Hx     Learning disabilities Neg Hx     Mental illness Neg Hx     Mental retardation Neg Hx     Miscarriages / Stillbirths Neg Hx     Stroke Neg Hx     Vision loss Neg Hx     Varicose Veins Neg Hx     Pancreatic cancer Neg Hx     Colon cancer Neg Hx     Hypertension Neg Hx     Rectal cancer Neg Hx     Stomach cancer Neg Hx            Review of Systems:    Constitutional: No weight loss, fever, chills, weakness or fatigue HEENT: Eyes: No change in vision               Ears, Nose, Throat:  No change in hearing or congestion Skin: No rash or itching Cardiovascular: No chest pain, chest pressure or palpitations   Respiratory: No SOB or cough Gastrointestinal: See HPI and otherwise negative Genitourinary: No dysuria or change in urinary frequency Neurological: No headache, dizziness or syncope Musculoskeletal: No new muscle or joint pain Hematologic: No bleeding or bruising Psychiatric: No history of depression or anxiety      Physical Exam:  Vital signs: BP 122/78   Pulse 74   Ht 5' 2.25 (1.581 m)   Wt 178 lb (80.7 kg)   BMI 32.30 kg/m    Constitutional:  Pleasant Hispanic female appears to be in NAD, Well developed, Well nourished, alert and cooperative Throat: Oral cavity and pharynx without inflammation, swelling or lesion.  Respiratory: Respirations even and unlabored. Lungs clear to auscultation bilaterally.   No wheezes, crackles, or rhonchi.  Cardiovascular: Normal S1, S2. Regular rate and rhythm. No peripheral edema, cyanosis or pallor.  Gastrointestinal:  Soft, nondistended, nontender. No rebound or guarding. Normal bowel sounds. No appreciable masses or hepatomegaly. Rectal:  Not performed.  Msk:  Symmetrical without gross deformities. Without edema, no deformity or joint abnormality.  Neurologic:  Alert and  oriented x4;  grossly normal neurologically.  Skin:   Dry and intact without  significant lesions or rashes. Psychiatric: Oriented to person, place and time. Demonstrates good judgement and reason without abnormal affect or behaviors.   RELEVANT LABS AND IMAGING: CBC     Latest Ref Rng & Units 06/04/2022   11:16 PM 06/19/2021    9:33 AM 10/29/2020   12:50 AM  CBC  WBC 4.0 - 10.5 K/uL 4.7  3.7  4.1   Hemoglobin 12.0 - 15.0 g/dL 87.7  87.0  87.4   Hematocrit 36.0 - 46.0 % 35.4  37.8  37.1   Platelets 150 - 400 K/uL 205  193.0  201       CMP         Latest Ref Rng & Units 06/05/2022   12:26 AM 06/04/2022   11:16 PM 06/19/2021    9:33 AM  CMP  Glucose 70 - 99 mg/dL   840  899   BUN 6 - 20 mg/dL   14  10   Creatinine 9.55 - 1.00 mg/dL   9.32  9.42   Sodium 864 - 145 mmol/L   138  138   Potassium 3.5 - 5.1 mmol/L   3.0  4.3   Chloride 98 - 111 mmol/L   107  106   CO2 22 - 32 mmol/L   19  27   Calcium  8.9 - 10.3 mg/dL   8.9  8.8   Total Protein 6.5 - 8.1 g/dL 7.0    6.8   Total Bilirubin 0.3 - 1.2 mg/dL 0.6    0.6   Alkaline Phos 38 - 126 U/L 89    74   AST 15 - 41 U/L 21    12   ALT 0 - 44 U/L 19    12       Assessment:     Encounter Diagnoses  Name Primary?   Pancreatic lesion Yes   Abdominal pain, epigastric       38 year old Hispanic female patient with intermittent epigastric pain not relieved with omeprazole  20 mg p.o. daily.  Her husband recently tested positive for H. Pylori, we will go ahead and order H. pylori Diatherix stool test.  Will start patient on famotidine  twice daily.  Along with strict GERD diet.  If H. pylori test is negative and no improvements in symptoms Carney consider endoscopy.      Incidental finding of cystic lesion in the tail of the pancreas on CT prior to last visit.  Dr. Charlanne ordered follow-up MRI/MRCP which showed stable 14 x 14 x 11 mm cystic lesion in the tail of the pancreas without discrete enhancing nodularity mural nodularity or pancreatic ductal dilation. Recommend follow up pre and post contrast MRI/MRCP in 1 year.    Plan: - Check CBC, CMP, lipase today - Strict GERD diet, no late meals 3-4 hours before lying down -Start Pepcid  20 mg 1 tablet before breakfast and dinner - Order H pylori Diatherix stool test -Repeat MRCP as suggested by radiology for pancreatic lesion     Thank you for the courtesy of this consult. Please call me with any questions or concerns.    Erin May, FNP-C [facility]    Attending physician's note   I have taken history, reviewed the chart and examined the patient. I performed a substantive portion of this encounter, including complete performance of at least one of the key components, in conjunction with the APP. I agree with the Advanced Practitioner's note, impression and recommendations.   GERD Epi pain 1.9 cm asymptomatic incidental PD tail cystic lesion. No red flags  Plan: -EGD -Serial MRCPs/labs -Continue PPis   Erin Charlanne, MD [facility] [phone]

## 2023-11-19 ENCOUNTER — Telehealth: Payer: Self-pay | Admitting: *Deleted

## 2023-11-19 NOTE — Telephone Encounter (Signed)
  Follow up Call-     11/18/2023    8:54 AM  Call back number  Post procedure Call Back phone  # [phone]  Permission to leave phone message Yes     Patient questions:  Do you have a fever, pain , or abdominal swelling? No. Pain Score  0 *  Have you tolerated food without any problems? Yes.    Have you been able to return to your normal activities? Yes.    Do you have any questions about your discharge instructions: Diet   No. Medications  No. Follow up visit  No.  Do you have questions or concerns about your Care? No.  Actions: * If pain score is 4 or above: No action needed, pain <4.

## 2023-11-20 LAB — SURGICAL PATHOLOGY

## 2023-11-23 ENCOUNTER — Ambulatory Visit: Payer: Self-pay | Admitting: Gastroenterology
